# Patient Record
Sex: Female | Born: 1975 | Race: White | Hispanic: Yes | Marital: Married | Smoking: Never smoker
Health system: Southern US, Community
[De-identification: ages and names within clinical notes are randomized; demographics above are authoritative.]

## PROBLEM LIST (undated history)

## (undated) DIAGNOSIS — M722 Plantar fascial fibromatosis: Secondary | ICD-10-CM

## (undated) DIAGNOSIS — Z8042 Family history of malignant neoplasm of prostate: Secondary | ICD-10-CM

## (undated) DIAGNOSIS — M419 Scoliosis, unspecified: Secondary | ICD-10-CM

## (undated) DIAGNOSIS — Z803 Family history of malignant neoplasm of breast: Secondary | ICD-10-CM

## (undated) DIAGNOSIS — J381 Polyp of vocal cord and larynx: Secondary | ICD-10-CM

## (undated) HISTORY — DX: Plantar fascial fibromatosis: M72.2

## (undated) HISTORY — DX: Family history of malignant neoplasm of breast: Z80.3

## (undated) HISTORY — PX: NO PAST SURGERIES: SHX2092

## (undated) HISTORY — DX: Polyp of vocal cord and larynx: J38.1

## (undated) HISTORY — DX: Scoliosis, unspecified: M41.9

## (undated) HISTORY — DX: Family history of malignant neoplasm of prostate: Z80.42

---

## 2005-07-05 ENCOUNTER — Ambulatory Visit: Payer: Self-pay | Admitting: Internal Medicine

## 2005-10-02 ENCOUNTER — Ambulatory Visit: Payer: Self-pay | Admitting: Internal Medicine

## 2007-02-20 ENCOUNTER — Ambulatory Visit: Payer: Self-pay | Admitting: Internal Medicine

## 2007-02-20 LAB — CONVERTED CEMR LAB
Albumin: 4.1 g/dL (ref 3.5–5.2)
CO2: 29 meq/L (ref 19–32)
Chloride: 107 meq/L (ref 96–112)
Eosinophils Relative: 2.4 % (ref 0.0–5.0)
GFR calc Af Amer: 84 mL/min
GFR calc non Af Amer: 69 mL/min
Glucose, Bld: 104 mg/dL — ABNORMAL HIGH (ref 70–99)
Glucose, Urine, Semiquant: NEGATIVE
HDL: 74.8 mg/dL (ref >39.0)
Ketones, urine, test strip: NEGATIVE
MCHC: 34 g/dL (ref 30.0–36.0)
Platelets: 190 10*3/uL (ref 150–400)
RBC: 3.94 M/uL (ref 3.87–5.11)
RDW: 11.4 % — ABNORMAL LOW (ref 11.5–14.6)
Sodium: 140 meq/L (ref 135–145)
Specific Gravity, Urine: 1.015
Total CHOL/HDL Ratio: 2.4
Triglycerides: 42 mg/dL (ref 0–149)
WBC: 4.4 10*3/uL — ABNORMAL LOW (ref 4.5–10.5)
pH: 8.5

## 2007-02-27 ENCOUNTER — Ambulatory Visit: Payer: Self-pay | Admitting: Internal Medicine

## 2007-02-27 ENCOUNTER — Encounter: Payer: Self-pay | Admitting: Internal Medicine

## 2007-02-27 ENCOUNTER — Other Ambulatory Visit: Admission: RE | Admit: 2007-02-27 | Discharge: 2007-02-27 | Payer: Self-pay | Admitting: Internal Medicine

## 2007-02-27 DIAGNOSIS — R7309 Other abnormal glucose: Secondary | ICD-10-CM | POA: Insufficient documentation

## 2007-04-16 ENCOUNTER — Telehealth: Payer: Self-pay | Admitting: Internal Medicine

## 2007-06-12 ENCOUNTER — Ambulatory Visit: Payer: Self-pay | Admitting: Family Medicine

## 2007-06-22 ENCOUNTER — Telehealth: Payer: Self-pay | Admitting: Internal Medicine

## 2008-04-01 ENCOUNTER — Encounter: Payer: Self-pay | Admitting: Internal Medicine

## 2009-02-23 ENCOUNTER — Ambulatory Visit: Payer: Self-pay | Admitting: Internal Medicine

## 2009-02-23 LAB — CONVERTED CEMR LAB
AST: 17 units/L (ref 0–37)
Alkaline Phosphatase: 42 units/L (ref 39–117)
BUN: 14 mg/dL (ref 6–23)
Basophils Absolute: 0 10*3/uL (ref 0.0–0.1)
Bilirubin Urine: NEGATIVE
Blood in Urine, dipstick: NEGATIVE
Cholesterol: 204 mg/dL — ABNORMAL HIGH (ref 0–200)
Direct LDL: 107.3 mg/dL
Eosinophils Absolute: 0.1 10*3/uL (ref 0.0–0.7)
Eosinophils Relative: 3.3 % (ref 0.0–5.0)
GFR calc non Af Amer: 76.71 mL/min (ref >60)
Glucose, Urine, Semiquant: NEGATIVE
HCT: 39.9 % (ref 36.0–46.0)
HDL: 79.7 mg/dL (ref >39.00)
Hemoglobin: 13.9 g/dL (ref 12.0–15.0)
Lymphs Abs: 1.5 10*3/uL (ref 0.7–4.0)
Monocytes Absolute: 0.3 10*3/uL (ref 0.1–1.0)
Neutro Abs: 2.3 10*3/uL (ref 1.4–7.7)
Potassium: 4.4 meq/L (ref 3.5–5.1)
Protein, U semiquant: NEGATIVE
RBC: 4.17 M/uL (ref 3.87–5.11)
RDW: 11.3 % — ABNORMAL LOW (ref 11.5–14.6)
Specific Gravity, Urine: 1.02
TSH: 2.32 microintl units/mL (ref 0.35–5.50)
Total CHOL/HDL Ratio: 3
VLDL: 12 mg/dL (ref 0.0–40.0)
WBC: 4.2 10*3/uL — ABNORMAL LOW (ref 4.5–10.5)
pH: 5.5

## 2009-03-07 ENCOUNTER — Other Ambulatory Visit: Admission: RE | Admit: 2009-03-07 | Discharge: 2009-03-07 | Payer: Self-pay | Admitting: Internal Medicine

## 2009-03-07 ENCOUNTER — Ambulatory Visit: Payer: Self-pay | Admitting: Internal Medicine

## 2009-03-07 ENCOUNTER — Encounter: Payer: Self-pay | Admitting: Internal Medicine

## 2010-03-16 ENCOUNTER — Encounter: Payer: Self-pay | Admitting: Internal Medicine

## 2010-03-16 ENCOUNTER — Ambulatory Visit: Payer: Self-pay | Admitting: Family Medicine

## 2010-03-16 DIAGNOSIS — N926 Irregular menstruation, unspecified: Secondary | ICD-10-CM | POA: Insufficient documentation

## 2010-03-16 LAB — CONVERTED CEMR LAB
Basophils Absolute: 0.1 10*3/uL (ref 0.0–0.1)
Basophils Relative: 1 % (ref 0–1)
Beta hcg, urine, semiquantitative: NEGATIVE
Eosinophils Absolute: 0.2 10*3/uL (ref 0.0–0.7)
Eosinophils Relative: 2 % (ref 0–5)
HCT: 38.9 % (ref 36.0–46.0)
Hemoglobin: 13.3 g/dL (ref 12.0–15.0)
MCV: 93.1 fL (ref 78.0–100.0)
Monocytes Absolute: 0.4 10*3/uL (ref 0.1–1.0)
Monocytes Relative: 6 % (ref 3–12)
Platelets: 225 10*3/uL (ref 150–400)
RBC: 4.18 M/uL (ref 3.87–5.11)
WBC: 6.1 10*3/uL (ref 4.0–10.5)

## 2010-03-21 ENCOUNTER — Telehealth: Payer: Self-pay | Admitting: Family Medicine

## 2010-03-26 ENCOUNTER — Encounter: Payer: Self-pay | Admitting: Sports Medicine

## 2010-05-10 ENCOUNTER — Ambulatory Visit: Payer: Self-pay | Admitting: Internal Medicine

## 2010-05-10 LAB — CONVERTED CEMR LAB
ALT: 17 units/L (ref 0–35)
AST: 21 units/L (ref 0–37)
Albumin: 4.2 g/dL (ref 3.5–5.2)
BUN: 17 mg/dL (ref 6–23)
Basophils Absolute: 0 10*3/uL (ref 0.0–0.1)
Basophils Relative: 0.5 % (ref 0.0–3.0)
Bilirubin Urine: NEGATIVE
CO2: 25 meq/L (ref 19–32)
Calcium: 9.1 mg/dL (ref 8.4–10.5)
Cholesterol: 233 mg/dL — ABNORMAL HIGH (ref 0–200)
Creatinine, Ser: 0.8 mg/dL (ref 0.4–1.2)
Direct LDL: 130.9 mg/dL
Eosinophils Absolute: 0.1 10*3/uL (ref 0.0–0.7)
Eosinophils Relative: 1.9 % (ref 0.0–5.0)
Glucose, Bld: 86 mg/dL (ref 70–99)
Glucose, Urine, Semiquant: NEGATIVE
MCHC: 34.6 g/dL (ref 30.0–36.0)
MCV: 95.3 fL (ref 78.0–100.0)
Monocytes Relative: 4.7 % (ref 3.0–12.0)
Neutrophils Relative %: 55.8 % (ref 43.0–77.0)
RBC: 3.99 M/uL (ref 3.87–5.11)
Sodium: 137 meq/L (ref 135–145)
Total Bilirubin: 0.4 mg/dL (ref 0.3–1.2)
VLDL: 12 mg/dL (ref 0.0–40.0)
WBC Urine, dipstick: NEGATIVE
WBC: 4.2 10*3/uL — ABNORMAL LOW (ref 4.5–10.5)
pH: 6.5

## 2010-05-16 ENCOUNTER — Other Ambulatory Visit
Admission: RE | Admit: 2010-05-16 | Discharge: 2010-05-16 | Payer: Self-pay | Source: Home / Self Care | Admitting: Internal Medicine

## 2010-05-16 ENCOUNTER — Ambulatory Visit: Payer: Self-pay | Admitting: Internal Medicine

## 2010-05-16 DIAGNOSIS — M25559 Pain in unspecified hip: Secondary | ICD-10-CM | POA: Insufficient documentation

## 2010-05-21 LAB — CONVERTED CEMR LAB
Free T4: 0.76 ng/dL (ref 0.60–1.60)
TSH: 2.46 microintl units/mL (ref 0.35–5.50)

## 2010-05-28 LAB — CONVERTED CEMR LAB: Pap Smear: NEGATIVE

## 2010-06-12 ENCOUNTER — Ambulatory Visit: Payer: Self-pay | Admitting: Sports Medicine

## 2010-06-12 DIAGNOSIS — R269 Unspecified abnormalities of gait and mobility: Secondary | ICD-10-CM | POA: Insufficient documentation

## 2010-06-12 DIAGNOSIS — M2170 Unequal limb length (acquired), unspecified site: Secondary | ICD-10-CM | POA: Insufficient documentation

## 2010-07-19 ENCOUNTER — Ambulatory Visit
Admission: RE | Admit: 2010-07-19 | Discharge: 2010-07-19 | Payer: Self-pay | Source: Home / Self Care | Attending: Sports Medicine | Admitting: Sports Medicine

## 2010-07-24 NOTE — Assessment & Plan Note (Signed)
Summary: CPX W/ PAP // RS   Vital Signs:  Patient profile:   35 year old female Menstrual status:  regular LMP:     05/02/2010 Height:      69.75 inches Weight:      194 pounds BMI:     28.14 Pulse rate:   66 / minute BP sitting:   120 / 80  (right arm) Cuff size:   regular  Vitals Entered By: Romualdo Bolk, CMA Duncan Dull) (May 16, 2010 11:47 AM)  Nutrition Counseling: Patient's BMI is greater than 25 and therefore counseled on weight management options. CC: CPX with pap LMP (date): 05/02/2010 LMP - Character: normal Menarche (age onset years): 10-11   Menses interval (days): 28 Menstrual flow (days): 4 Enter LMP: 05/02/2010 Last PAP Result NEGATIVE FOR INTRAEPITHELIAL LESIONS OR MALIGNANCY.   History of Present Illness: Patty Bell comes in today  fpr preventive visit   since you said she's been generally well but has gained some weight because of lifestyle issues she had been exercising but now is having problems after an  running  with   knee and right hip back pain.   She is seeing  [facility]   Dr Margaretha Sheffield.   and is just finishing physical therapy that she does not think helped very much.  there's no chest pain shortness of breath. She has had some irregular periods recently times one. She's due for a Pap smear.  Preventive Care Screening  Prior Values:    Pap Smear:  NEGATIVE FOR INTRAEPITHELIAL LESIONS OR MALIGNANCY. (03/07/2009)    Last Tetanus Booster:  Historical (06/24/2005)   Preventive Screening-Counseling & Management  Alcohol-Tobacco     Alcohol drinks/day: 2     Alcohol type: wine     Smoking Status: never  Caffeine-Diet-Exercise     Caffeine use/day: 1-2     Does Patient Exercise: yes     Type of exercise: volleyball     Times/week: 3  Hep-HIV-STD-Contraception     Dental Visit-last 6 months yes     Sun Exposure-Excessive: no  Safety-Violence-Falls     Seat Belt Use: yes     Firearms in the Home: no firearms in the home  Smoke Detectors: yes  Current Problems (verified): 1)  Irregular Menses  (ICD-626.4) 2)  Hyperglycemia, Fasting  (ICD-790.29) 3)  Gynecological Examination, Routine  (ICD-V72.31) 4)  Well Woman  (ICD-V70.0)  Current Medications (verified): 1)  None  Allergies (verified): 1)  ! Amoxicillin  Past History:  Past medical, surgical, family and social histories (including risk factors) reviewed, and no changes noted (except as noted below).  Past Medical History: asthma as a child cyst or polyp in her throat as a child plantar fasciitis G1P0  Past History:  Care Management: None Current Sports medicine : [facility]  Family History: Reviewed history from 03/07/2009 and no changes required. Family History High cholesterol parent and grandparent Family History Hypertension Family History of Cardiovascular disorder Fam hx stroke Neg hx breast cancer   Social History: Reviewed history from 03/07/2009 and no changes required. 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  no pets   Review of Systems       12 sytem review neg except as per HPI  Physical Exam  General:  Well-developed,well-nourished,in no acute distress; alert,appropriate and cooperative throughout examination Head:  normocephalic, atraumatic, and no abnormalities  observed.   Eyes:  PERRL, EOMs full, conjunctiva clear  Ears:  R ear normal, L ear normal, and no external deformities.   Nose:  no external deformity, no external erythema, and no nasal discharge.   Mouth:  good dentition and pharynx pink and moist.   Neck:  No deformities, masses, or tenderness noted. Breasts:  No mass, nodules, thickening, tenderness, bulging, retraction, inflamation, nipple discharge or skin changes noted.   Lungs:  Normal respiratory effort, chest expands symmetrically. Lungs are  clear to auscultation, no crackles or wheezes. Heart:  Normal rate and regular rhythm. S1 and S2 normal without gallop, murmur, click, rub or other extra sounds. Abdomen:  Bowel sounds positive,abdomen soft and non-tender without masses, organomegaly or hernias noted. Genitalia:  Pelvic Exam:        External: normal female genitalia without lesions or masses        Vagina: normal without lesions or masses        Cervix: normal without lesions or masses        Adnexa: normal bimanual exam without masses or fullness        Uterus: normal by palpation        Pap smear: performed Msk:  no joint swelling, no joint warmth, and no redness over joints.   no redness  gait ok  Pulses:  pulses intact without delay   Extremities:  no clubbing cyanosis or edema  Neurologic:  alert & oriented X3, strength normal in all extremities, and gait normal.   Skin:  turgor normal, color normal, no ecchymoses, no petechiae, no purpura, no ulcerations, and no edema.   Cervical Nodes:  No lymphadenopathy noted Axillary Nodes:  No palpable lymphadenopathy Inguinal Nodes:  No significant adenopathy Psych:  Oriented X3, normally interactive, and good eye contact.   labs reviewed tsh was not performed as expected so will get this test today  Impression & Recommendations:  Problem # 1:  WELL WOMAN (ICD-V70.0) Discussed nutrition,exercise,diet,healthy weight, vitamin D and calcium.    disc cross training  .  Orders: TLB-TSH (Thyroid Stimulating Hormone) (84443-TSH) TLB-T4 (Thyrox), Free [phone])  Problem # 2:  GYNECOLOGICAL EXAMINATION, ROUTINE (ICD-V72.31)  PAP done   disc   expectation about conception  and fertility  Orders: Pap Smear, Thin Prep ( Collection of) (B1478)  Problem # 3:  IRREGULAR MENSES (ICD-626.4) could be realted to weight change and other  nl exam  disc fertility   and age.    Orders: TLB-T4 (Thyrox), Free [phone])  Problem # 4:  HIP PAIN (ICD-719.45)  .fu with Dr Margaretha Sheffield  as  dcussed  Problem # 5:  HYPERGLYCEMIA, FASTING (ICD-790.29) Assessment: Improved  Labs Reviewed: Creat: 0.8 (05/10/2010)     Patient Instructions: 1)  You will be informed of lab/PAP results when available.  2)  see about cross training exercise  3)  agree with heart healthy diet with " mediterranean type diet"   Orders Added: 1)  TLB-TSH (Thyroid Stimulating Hormone) [84443-TSH] 2)  TLB-T4 (Thyrox), Free [65784-ON6E] 3)  Est. Patient 18-39 years [99395] 4)  Pap Smear, Thin Prep ( Collection of) [Q0091]

## 2010-07-24 NOTE — Consult Note (Signed)
Summary: [facility]  [facility]   Imported By: Marily Memos 05/23/2010 11:41:02  _____________________________________________________________________  External Attachment:    Type:   Image     Comment:   External Document

## 2010-07-24 NOTE — Assessment & Plan Note (Signed)
Summary: irregular vag. bleeding and pain/dm   Vital Signs:  Patient profile:   35 year old female Menstrual status:  regular Weight:      190 pounds Temp:     98.0 degrees F oral BP sitting:   118 / 90  (left arm) Cuff size:   regular  Vitals Entered By: Sid Falcon LPN (March 16, 2010 3:58 PM) CC: irregular bleeding   History of Present Illness: Patient seen with some slight irregular vaginal bleeding.  Normally has menstrual period around the 20th of every month. This month was different in that she started her menstrual period on the 15th. Typical of usual periods with 5 days of menses and bleeding ceased by Sunday. She had no bleeding on Monday or Tuesday and Wednesday had some recurrence of bleeding followed by some mild spotting on Thursday. Essentially no bleeding or spotting today. Other than some mild perimenstrual cramping, no pain. Denies any fever or chills. Has been off birth control for about 3 years now. No aspirin use.  No other bleeding problems.  No fever or chills.  Has upcoming PE soon.  Allergies: 1)  ! 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  PMH reviewed for relevance, SH/Risk Factors reviewed for relevance  Review of Systems  The patient denies anorexia, fever, weight loss, abdominal pain, melena, hematochezia, hematuria, and suspicious skin lesions.    Physical Exam  General:  Well-developed,well-nourished,in no acute distress; alert,appropriate and cooperative throughout examination Lungs:  Normal respiratory effort, chest expands symmetrically. Lungs are clear to auscultation, no crackles or  wheezes. Heart:  Normal rate and regular rhythm. S1 and S2 normal without gallop, murmur, click, rub or other extra sounds. Abdomen:  Bowel sounds positive,abdomen soft and non-tender without masses, organomegaly or hernias noted. Genitalia:  bimanual.  Cervix nontender.  Uterus normal size and nontender.  No adnexal mass or tenderness noted.  No bleeding noted at this time.   Impression & Recommendations:  Problem # 1:  IRREGULAR MENSES (ICD-626.4) Assessment New mild irregularity.  Start with preg test and CBC.  consider ultrasound if persists. Orders: Pregnancy Test, serum qualitative (47425) TLB-CBC Platelet - w/Differential (85025-CBCD) Venipuncture (95638) Specimen Handling (75643)  Patient Instructions: 1)  Follow up promptly for any fever, increased pelvic pain, or any heavy vaginal bleeding.  Laboratory Results   Urine Tests  Date/Time Recieved: March 16, 2010 4:45 PM  Date/Time Reported: March 16, 2010 4:45 PM     Urine HCG: negative Comments: Wynona Canes, CMA  March 16, 2010 4:45 PM

## 2010-07-24 NOTE — Progress Notes (Signed)
Summary: lab results  Phone Note Call from Patient   Caller: Patient Call For: Patty Bell Headings MD Summary of Call: Pt is asking for results of labs. [phone] Initial call taken by: Lynann Beaver CMA,  March 21, 2010 9:39 AM  Follow-up for Phone Call        CBC normal.  I did not see result until now as this had been routed to  Dr Fabian Sharp. Follow-up by: Evelena Peat MD,  March 21, 2010 9:45 AM  Additional Follow-up for Phone Call Additional follow up Details #1::        [facility] Additional Follow-up by: Lynann Beaver CMA,  March 21, 2010 9:54 AM    Additional Follow-up for Phone Call Additional follow up Details #2::    Results given  on callback &copy at front for pickup as requested. Follow-up by: Rudy Jew, RN,  March 21, 2010 10:14 AM

## 2010-07-26 NOTE — Assessment & Plan Note (Signed)
Summary: NP,UPPER HIP PAIN X 6 MONTHS,STARTING TO RUN,MC   Vital Signs:  Patient profile:   35 year old female Menstrual status:  regular Height:      69.75 inches Weight:      190 pounds BMI:     27.56 Pulse rate:   81 / minute BP sitting:   142 / 93  (right arm)  Vitals Entered By: Rochele Pages RN (June 12, 2010 11:33 AM) CC: rt hip pain x6 months   CC:  rt hip pain x6 months.  History of Present Illness: Pt presents to clinic for evaluation of rt superior hip pain that she has experienced x 6 months.  She also experienced lt lateral knee pain that has since resolved.  She is a new runner x 8 months- has run 2 - 10 ks, usually runs approx 3 miles 3 times per week.   In November the lt hip pain got sharp and severe,  hard to sleep at night.  Pain has since improved, now is a constant dull aching.  Has taken ibuprofen rarely and this is helpful.     Hx of playing competitive volleyball for 20 yrs.   Has seen Dr Margaretha Sheffield in past and done PT but did not completly resolve issues  Dr Adonis Housekeeper ( a fried and SM doc) suggested we see her for opinion/  Dr Margaretha Sheffield noted that she may want [place] to do a biomechanical assessment  Preventive Screening-Counseling & Management  Alcohol-Tobacco     Smoking Status: never  Allergies: 1)  ! Amoxicillin  Physical Exam  General:  Well-developed,well-nourished,in no acute distress; alert,appropriate and cooperative throughout examination Msk:  Tenderness at anterior third of iliac crest near insertion of tensor facia lata  Internal and external rotation strong  hip flexion strong  quad strength strong Lt leg 2 cm longer than rt Knee to chst neg bilat Faber neg bilat SI joint neg Good abduction strength bilat Lt tensor fasciaelatae stronger than rt Moderately high arches Mild transverse arch loss bilat    Extremities:  Gait does show that RT hemi pelvis rotates differently than left there is a mild trendelenburg shift to RT and foot  turnout which worsens the leg length diff   Impression & Recommendations:  Problem # 1:  HIP PAIN (ICD-719.45) Injury pattern does suggest that this arises from tensor fascia lata  weak on testing this muscle and on sartorius on RT  likely that this is triggered by biomechanical stress as no similar findings on left  Problem # 2:  UNEQUAL LEG LENGTH (ICD-736.81) correction placed in shoe on RT with insole and lift  This does improve gait and even lessens turnout on RT somewhat  will need to do gait drills though  Problem # 3:  ABNORMALITY OF GAIT (ICD-781.2)  work with exercise program  try correction for 1 month  ease into running with pain level <3/10 and no limp  reck 1 month  Orders: Sports Insoles [phone])  Patient Instructions: 1)  You have an injury to your tensor fasciaelatae due to your leg length difference.   2)  Do exercises from paperwork daily 3)  Pigeon toed walking across a room or up steps 4)  Running line drills two times per week  5)  Return for follow up in 4-6 weeks   Orders Added: 1)  Est. Patient Level III [60454] 2)  Sports Insoles [L3510]

## 2010-07-26 NOTE — Assessment & Plan Note (Signed)
Summary: FU APPT HIP PAIN/MJD   Vital Signs:  Patient profile:   35 year old female Menstrual status:  regular Pulse rate:   67 / minute BP sitting:   126 / 85  (right arm)  Vitals Entered By: Rochele Pages RN (July 19, 2010 3:47 PM) CC: f/u L hip pain, and rt lateral knee pain   CC:  f/u L hip pain and and rt lateral knee pain.  History of Present Illness: 35 yo Female here for R hip pain f/u  Seen initially in December 2010 Was given heel insert and HEP for leg length discrepancy (L > R) Hip is doing well-90% improved. Compliant with HEP. Does not wake her up from sleep at night. Not taking any Rx or OTC. Running up to 9 miles/week  c/o L lateral knee pain with and after activity. Working out with trainer 2x/week. Does admit to increasing physical activity this week. Previous hx of IT band friction syndrome but is resolved.  Preventive Screening-Counseling & Management  Alcohol-Tobacco     Smoking Status: never  Allergies: 1)  ! Amoxicillin  Physical Exam  General:  Well-developed,well-nourished,in no acute distress; alert,appropriate and cooperative throughout examination Msk:  Approx 80 degrees of hip rotation bilaterally No TTP of hips bilaterally Full active and passive ROM of hips bilaterally Good hip strength bilaterally Good strength of L tensor fascia latae Good strength of sartorius muscle bilaterally No TTP of knees bilaterally Full active and passive ROM of knees bilaterally Negative Thessaly test on L knee Negative Lachman test on L knee  lt knee exam shows no effusion; stable ligaments; negative Mcmurray's and provocative meniscal tests; non painful patellar compression; patellar and quadriceps tendons unremarkable.   Additional Exam:  Slight out-toeing of the R foot during jog   Impression & Recommendations:  Problem # 1:  HIP PAIN (ICD-719.45) Assessment Improved 90% resolved  use exercises at least 3x wk  Problem # 2:  UNEQUAL LEG  LENGTH (ICD-736.81) use lift sin most shoes as the difference is 2 cms and is significant  extra lifts given for non sports shoes  Problem # 3:  ABNORMALITY OF GAIT (ICD-781.2) this is improving with exercises  I think it triggers some ITB sxs on left knee keep working gait  stretches given  reck if it does not resolve otehrwise as needed RTC   Orders Added: 1)  Est. Patient Level III [16109]

## 2011-05-14 ENCOUNTER — Other Ambulatory Visit (INDEPENDENT_AMBULATORY_CARE_PROVIDER_SITE_OTHER): Payer: 59

## 2011-05-14 DIAGNOSIS — Z0000 Encounter for general adult medical examination without abnormal findings: Secondary | ICD-10-CM

## 2011-05-14 LAB — LIPID PANEL
Cholesterol: 204 mg/dL — ABNORMAL HIGH (ref 0–200)
HDL: 94.8 mg/dL (ref >39.00)
Total CHOL/HDL Ratio: 2
Triglycerides: 78 mg/dL (ref 0.0–149.0)
VLDL: 15.6 mg/dL (ref 0.0–40.0)

## 2011-05-14 LAB — POCT URINALYSIS DIPSTICK
Blood, UA: NEGATIVE
Glucose, UA: NEGATIVE
Spec Grav, UA: 1.025

## 2011-05-14 LAB — BASIC METABOLIC PANEL
BUN: 18 mg/dL (ref 6–23)
CO2: 23 mEq/L (ref 19–32)
Calcium: 9.5 mg/dL (ref 8.4–10.5)
Chloride: 106 mEq/L (ref 96–112)
Creatinine, Ser: 1 mg/dL (ref 0.4–1.2)
GFR: 71.12 mL/min (ref >60.00)
Glucose, Bld: 84 mg/dL (ref 70–99)
Potassium: 4 mEq/L (ref 3.5–5.1)
Sodium: 140 mEq/L (ref 135–145)

## 2011-05-14 LAB — CBC WITH DIFFERENTIAL/PLATELET
Basophils Absolute: 0 10*3/uL (ref 0.0–0.1)
Basophils Relative: 0.5 % (ref 0.0–3.0)
Eosinophils Absolute: 0.1 10*3/uL (ref 0.0–0.7)
Eosinophils Relative: 1.3 % (ref 0.0–5.0)
HCT: 38.8 % (ref 36.0–46.0)
Hemoglobin: 13.1 g/dL (ref 12.0–15.0)
Lymphocytes Relative: 27.2 % (ref 12.0–46.0)
Lymphs Abs: 1.4 10*3/uL (ref 0.7–4.0)
MCHC: 33.9 g/dL (ref 30.0–36.0)
MCV: 97 fl (ref 78.0–100.0)
Monocytes Absolute: 0.3 10*3/uL (ref 0.1–1.0)
Monocytes Relative: 5.1 % (ref 3.0–12.0)
Neutro Abs: 3.3 10*3/uL (ref 1.4–7.7)
Neutrophils Relative %: 65.9 % (ref 43.0–77.0)
Platelets: 152 10*3/uL (ref 150.0–400.0)
RBC: 4 Mil/uL (ref 3.87–5.11)
RDW: 12.1 % (ref 11.5–14.6)
WBC: 5 10*3/uL (ref 4.5–10.5)

## 2011-05-14 LAB — TSH: TSH: 2.25 u[IU]/mL (ref 0.35–5.50)

## 2011-05-14 LAB — HEPATIC FUNCTION PANEL
Albumin: 4.6 g/dL (ref 3.5–5.2)
Alkaline Phosphatase: 39 U/L (ref 39–117)
Total Protein: 7.2 g/dL (ref 6.0–8.3)

## 2011-05-15 LAB — LDL CHOLESTEROL, DIRECT: Direct LDL: 103.4 mg/dL

## 2011-05-27 ENCOUNTER — Encounter: Payer: Self-pay | Admitting: Internal Medicine

## 2011-05-28 ENCOUNTER — Ambulatory Visit (INDEPENDENT_AMBULATORY_CARE_PROVIDER_SITE_OTHER): Payer: 59 | Admitting: Internal Medicine

## 2011-05-28 ENCOUNTER — Encounter: Payer: Self-pay | Admitting: Internal Medicine

## 2011-05-28 VITALS — BP 110/70 | HR 60 | Ht 69.75 in | Wt 182.0 lb

## 2011-05-28 DIAGNOSIS — Z0000 Encounter for general adult medical examination without abnormal findings: Secondary | ICD-10-CM

## 2011-05-28 DIAGNOSIS — Z7189 Other specified counseling: Secondary | ICD-10-CM

## 2011-05-28 NOTE — Progress Notes (Signed)
  Subjective:    Patient ID: Patty Bell, female    DOB: 1976-03-24, 35 y.o.   MRN: [id number]  HPI Patient comes in today for Preventive Health Care visit  No major change in health status since last visit .   Marathon recently.  Runs regularly Has  Leg length discrepancy.  That caused her hip pain  and now has insert.  better.  Uses voltaren gel occassionally. Considering    Conception next year.  Disc  Periods ocass irreg  By a week or so    Pap last year and normal on menses today Review of Systems ROS:  GEN/ HEENTNo fever, significant weight changes sweats headaches vision problems hearing changes, CV/ PULM; No chest pain shortness of breath cough, syncope,edema  change in exercise tolerance. GI /GU: No adominal pain, vomiting, change in bowel habits. No blood in the stool. No significant GU symptoms. SKIN/HEME: ,no acute skin rashes suspicious lesions or bleeding. No lymphadenopathy, nodules, masses.  NEURO/ PSYCH:  No neurologic signs such as weakness numbness No depression anxiety. IMM/ Allergy: No unusual infections.  Allergy .   REST of 12 system review negative     Objective:   Physical Exam Physical Exam: Vital signs reviewed WUJ:WJXB is a well-developed well-nourished alert cooperative  white female who appears her stated age in no acute distress.  HEENT: normocephalic  traumatic , Eyes: PERRL EOM's full, conjunctiva clear, Nares: paten,t no deformity discharge or tenderness., Ears: no deformity EAC's clear TMs with normal landmarks. Mouth: clear OP, no lesions, edema.  Moist mucous membranes. Dentition in adequate repair. NECK: supple without masses, thyromegaly or bruits. Breast: normal by inspection . No dimpling, discharge, masses, tenderness or discharge . LN: no cervical axillary inguinal adenopathy  CHEST/PULM:  Clear to auscultation and percussion breath sounds equal no wheeze , rales or rhonchi. No chest wall deformities or tenderness. CV: PMI is  nondisplaced, S1 S2 no gallops, murmurs, rubs. Peripheral pulses are full without delay.No JVD .  ABDOMEN: Bowel sounds normal nontender  No guard or rebound, no hepato splenomegal no CVA tenderness.  No hernia. Extremtities:  No clubbing cyanosis or edema, no acute joint swelling or redness no focal atrophy NEURO:  Oriented x3, cranial nerves 3-12 appear to be intact, no obvious focal weakness,gait within normal limits no abnormal reflexes or asymmetrical SKIN: No acute rashes normal turgor, color, no bruising or petechiae. PSYCH: Oriented, good eye contact, no obvious depression anxiety, cognition and judgment appear normal.  Lab Results  Component Value Date   WBC 5.0 05/14/2011   HGB 13.1 05/14/2011   HCT 38.8 05/14/2011   PLT 152.0 05/14/2011   GLUCOSE 84 05/14/2011   CHOL 204* 05/14/2011   TRIG 78.0 05/14/2011   HDL 94.80 05/14/2011   LDLDIRECT 103.4 05/14/2011   LDLCALC 94 02/20/2007   ALT 19 05/14/2011   AST 22 05/14/2011   NA 140 05/14/2011   K 4.0 05/14/2011   CL 106 05/14/2011   CREATININE 1.0 05/14/2011   BUN 18 05/14/2011   CO2 23 05/14/2011   TSH 2.25 05/14/2011          Assessment & Plan:  Preventive Health Care Counseled regarding healthy nutrition, exercise, sleep, injury prevention, calcium vit d and healthy weight .Labs good for now Preconception nutrition and rx for prenatals   Up to date  on healthcare parameters per hx .

## 2011-05-28 NOTE — Patient Instructions (Signed)
Continue lifestyle intervention healthy eating and exercise . Check up in a year  Take prenatal vitamins to get folic acid  Pre conception

## 2011-06-06 ENCOUNTER — Ambulatory Visit (INDEPENDENT_AMBULATORY_CARE_PROVIDER_SITE_OTHER): Payer: 59 | Admitting: Sports Medicine

## 2011-06-06 VITALS — BP 112/78

## 2011-06-06 DIAGNOSIS — M2170 Unequal limb length (acquired), unspecified site: Secondary | ICD-10-CM

## 2011-06-06 DIAGNOSIS — R269 Unspecified abnormalities of gait and mobility: Secondary | ICD-10-CM

## 2011-06-06 DIAGNOSIS — M25559 Pain in unspecified hip: Secondary | ICD-10-CM

## 2011-06-06 NOTE — Assessment & Plan Note (Signed)
Much improved with lift

## 2011-06-06 NOTE — Progress Notes (Signed)
  Subjective:    Patient ID: Patty Bell, female    DOB: [birth year], 35 y.o.   MRN: [id number]  HPI  Pt presents to clinic for f/u of lt hip pain which has resolved with sports insoles with heel lift on the left. Was able to run 1/2 marathon this year without any problems.  Pt would like to get a few pairs of sports insoles with heel lifts.   Does have some lt lateral leg pain that starts after mile 8 with running.     Review of Systems     Objective:   Physical Exam  NAD  Left leg is 2 cm longer SI joints move freely, rt slightly tighter Hip abduction strong bilat Good hip rotation  No pain on resisted testing      Assessment & Plan:

## 2011-06-06 NOTE — Assessment & Plan Note (Signed)
Cont with felt tapered correction  Given 4 pairs of insoles with correction

## 2011-06-06 NOTE — Assessment & Plan Note (Signed)
This has resolved and is doing well  Keep up pretzel stretches and some hip and step exercises

## 2011-12-05 LAB — OB RESULTS CONSOLE RUBELLA ANTIBODY, IGM: Rubella: IMMUNE

## 2011-12-05 LAB — OB RESULTS CONSOLE RPR: RPR: NONREACTIVE

## 2011-12-05 LAB — OB RESULTS CONSOLE HIV ANTIBODY (ROUTINE TESTING): HIV: NONREACTIVE

## 2011-12-05 LAB — OB RESULTS CONSOLE HEPATITIS B SURFACE ANTIGEN: Hepatitis B Surface Ag: NEGATIVE

## 2012-01-16 LAB — OB RESULTS CONSOLE GC/CHLAMYDIA: Gonorrhea: NEGATIVE

## 2012-04-03 ENCOUNTER — Telehealth: Payer: Self-pay | Admitting: Internal Medicine

## 2012-04-03 NOTE — Telephone Encounter (Signed)
Error/njr °

## 2012-06-02 ENCOUNTER — Encounter: Payer: 59 | Admitting: Internal Medicine

## 2012-06-03 ENCOUNTER — Other Ambulatory Visit: Payer: 59

## 2012-08-06 ENCOUNTER — Encounter (HOSPITAL_COMMUNITY): Payer: Self-pay | Admitting: Anesthesiology

## 2012-08-06 ENCOUNTER — Inpatient Hospital Stay (HOSPITAL_COMMUNITY)
Admission: AD | Admit: 2012-08-06 | Discharge: 2012-08-09 | DRG: 766 | Disposition: A | Discharge status: Home / Self Care | Payer: 59 | Source: Ambulatory Visit | Attending: Obstetrics and Gynecology | Admitting: Obstetrics and Gynecology

## 2012-08-06 ENCOUNTER — Encounter (HOSPITAL_COMMUNITY): Payer: Self-pay

## 2012-08-06 ENCOUNTER — Encounter (HOSPITAL_COMMUNITY)
Admission: AD | Disposition: A | Discharge status: Home / Self Care | Payer: Self-pay | Source: Ambulatory Visit | Attending: Obstetrics and Gynecology

## 2012-08-06 ENCOUNTER — Inpatient Hospital Stay (HOSPITAL_COMMUNITY): Payer: 59 | Admitting: Anesthesiology

## 2012-08-06 DIAGNOSIS — O09519 Supervision of elderly primigravida, unspecified trimester: Secondary | ICD-10-CM | POA: Diagnosis present

## 2012-08-06 DIAGNOSIS — O324XX0 Maternal care for high head at term, not applicable or unspecified: Secondary | ICD-10-CM | POA: Diagnosis present

## 2012-08-06 LAB — COMPREHENSIVE METABOLIC PANEL WITH GFR
ALT: 31 U/L (ref 0–35)
AST: 27 U/L (ref 0–37)
Albumin: 3.1 g/dL — ABNORMAL LOW (ref 3.5–5.2)
Alkaline Phosphatase: 131 U/L — ABNORMAL HIGH (ref 39–117)
BUN: 10 mg/dL (ref 6–23)
CO2: 17 meq/L — ABNORMAL LOW (ref 19–32)
Calcium: 10 mg/dL (ref 8.4–10.5)
Chloride: 98 meq/L (ref 96–112)
Creatinine, Ser: 0.71 mg/dL (ref 0.50–1.10)
GFR calc Af Amer: >90 mL/min
GFR calc non Af Amer: >90 mL/min
Glucose, Bld: 103 mg/dL — ABNORMAL HIGH (ref 70–99)
Potassium: 3.5 meq/L (ref 3.5–5.1)
Sodium: 133 meq/L — ABNORMAL LOW (ref 135–145)
Total Bilirubin: 0.4 mg/dL (ref 0.3–1.2)
Total Protein: 6.3 g/dL (ref 6.0–8.3)

## 2012-08-06 LAB — CBC
HCT: 37.2 % (ref 36.0–46.0)
Hemoglobin: 13.3 g/dL (ref 12.0–15.0)
MCH: 31.7 pg (ref 26.0–34.0)
MCHC: 35.8 g/dL (ref 30.0–36.0)
MCV: 88.6 fL (ref 78.0–100.0)
Platelets: 137 K/uL — ABNORMAL LOW (ref 150–400)
RBC: 4.2 MIL/uL (ref 3.87–5.11)
RDW: 12 % (ref 11.5–15.5)
WBC: 14.8 K/uL — ABNORMAL HIGH (ref 4.0–10.5)

## 2012-08-06 LAB — OB RESULTS CONSOLE GBS: GBS: NEGATIVE

## 2012-08-06 LAB — RPR: RPR Ser Ql: NONREACTIVE

## 2012-08-06 LAB — URIC ACID: Uric Acid, Serum: 5.9 mg/dL (ref 2.4–7.0)

## 2012-08-06 SURGERY — Surgical Case
Anesthesia: Epidural | Site: Abdomen | Wound class: Clean Contaminated

## 2012-08-06 MED ORDER — PHENYLEPHRINE HCL 10 MG/ML IJ SOLN
INTRAMUSCULAR | Status: DC | PRN
  Administered 2012-08-06 (×5): 40 ug via INTRAVENOUS

## 2012-08-06 MED ORDER — KETOROLAC TROMETHAMINE 30 MG/ML IJ SOLN: 15.0000 mg | Freq: Once | INTRAMUSCULAR | Status: DC | PRN

## 2012-08-06 MED ORDER — EPHEDRINE 5 MG/ML INJ
10.0000 mg | INTRAVENOUS | Status: DC | PRN
  Filled 2012-08-06: qty 4

## 2012-08-06 MED ORDER — ZOLPIDEM TARTRATE 5 MG PO TABS: 5.0000 mg | ORAL_TABLET | Freq: Every evening | ORAL | Status: DC | PRN

## 2012-08-06 MED ORDER — DIPHENHYDRAMINE HCL 25 MG PO CAPS: 25.0000 mg | ORAL_CAPSULE | Freq: Four times a day (QID) | ORAL | Status: DC | PRN

## 2012-08-06 MED ORDER — KETOROLAC TROMETHAMINE 30 MG/ML IJ SOLN: 30.0000 mg | Freq: Four times a day (QID) | INTRAMUSCULAR | Status: AC | PRN

## 2012-08-06 MED ORDER — NALBUPHINE SYRINGE 5 MG/0.5 ML
5.0000 mg | INJECTION | INTRAMUSCULAR | Status: DC | PRN
  Filled 2012-08-06: qty 1

## 2012-08-06 MED ORDER — FENTANYL 2.5 MCG/ML BUPIVACAINE 1/10 % EPIDURAL INFUSION (WH - ANES)
INTRAMUSCULAR | Status: AC
  Filled 2012-08-06: qty 125

## 2012-08-06 MED ORDER — MEPERIDINE HCL 25 MG/ML IJ SOLN
6.2500 mg | INTRAMUSCULAR | Status: DC | PRN
  Administered 2012-08-06: 6.25 mg via INTRAVENOUS

## 2012-08-06 MED ORDER — MEPERIDINE HCL 25 MG/ML IJ SOLN
INTRAMUSCULAR | Status: AC
  Filled 2012-08-06: qty 1

## 2012-08-06 MED ORDER — SODIUM CHLORIDE 0.9 % IJ SOLN: 3.0000 mL | INTRAMUSCULAR | Status: DC | PRN

## 2012-08-06 MED ORDER — HYDROMORPHONE HCL PF 1 MG/ML IJ SOLN: 0.2500 mg | INTRAMUSCULAR | Status: DC | PRN

## 2012-08-06 MED ORDER — SIMETHICONE 80 MG PO CHEW: 80.0000 mg | CHEWABLE_TABLET | ORAL | Status: DC | PRN

## 2012-08-06 MED ORDER — ONDANSETRON HCL 4 MG/2ML IJ SOLN: 4.0000 mg | INTRAMUSCULAR | Status: DC | PRN

## 2012-08-06 MED ORDER — FENTANYL 2.5 MCG/ML BUPIVACAINE 1/10 % EPIDURAL INFUSION (WH - ANES): 14.0000 mL/h | INTRAMUSCULAR | Status: DC

## 2012-08-06 MED ORDER — IBUPROFEN 600 MG PO TABS: 600.0000 mg | ORAL_TABLET | Freq: Four times a day (QID) | ORAL | Status: DC | PRN

## 2012-08-06 MED ORDER — ACETAMINOPHEN 325 MG PO TABS: 650.0000 mg | ORAL_TABLET | ORAL | Status: DC | PRN

## 2012-08-06 MED ORDER — OXYCODONE-ACETAMINOPHEN 5-325 MG PO TABS: 1.0000 | ORAL_TABLET | ORAL | Status: DC | PRN

## 2012-08-06 MED ORDER — SCOPOLAMINE 1 MG/3DAYS TD PT72
1.0000 | MEDICATED_PATCH | Freq: Once | TRANSDERMAL | Status: AC
  Administered 2012-08-06: 1.5 mg via TRANSDERMAL

## 2012-08-06 MED ORDER — LACTATED RINGERS IV SOLN
INTRAVENOUS | Status: DC
  Administered 2012-08-06: 03:00:00 via INTRAVENOUS

## 2012-08-06 MED ORDER — OXYTOCIN 10 UNIT/ML IJ SOLN
40.0000 [IU] | INTRAVENOUS | Status: DC | PRN
  Administered 2012-08-06: 40 [IU] via INTRAVENOUS

## 2012-08-06 MED ORDER — PHENYLEPHRINE 40 MCG/ML (10ML) SYRINGE FOR IV PUSH (FOR BLOOD PRESSURE SUPPORT): 80.0000 ug | PREFILLED_SYRINGE | INTRAVENOUS | Status: DC | PRN

## 2012-08-06 MED ORDER — DIPHENHYDRAMINE HCL 25 MG PO CAPS: 25.0000 mg | ORAL_CAPSULE | ORAL | Status: DC | PRN

## 2012-08-06 MED ORDER — LACTATED RINGERS IV SOLN
INTRAVENOUS | Status: DC
  Administered 2012-08-06 – 2012-08-07 (×3): via INTRAVENOUS

## 2012-08-06 MED ORDER — ONDANSETRON HCL 4 MG/2ML IJ SOLN: 4.0000 mg | Freq: Three times a day (TID) | INTRAMUSCULAR | Status: DC | PRN

## 2012-08-06 MED ORDER — MORPHINE SULFATE (PF) 0.5 MG/ML IJ SOLN
INTRAMUSCULAR | Status: DC | PRN
  Administered 2012-08-06: 4 mg via EPIDURAL
  Administered 2012-08-06: 1 mg via INTRAVENOUS

## 2012-08-06 MED ORDER — DIPHENHYDRAMINE HCL 50 MG/ML IJ SOLN: 12.5000 mg | INTRAMUSCULAR | Status: DC | PRN

## 2012-08-06 MED ORDER — LIDOCAINE HCL (PF) 1 % IJ SOLN
INTRAMUSCULAR | Status: DC | PRN
  Administered 2012-08-06 (×2): 9 mL

## 2012-08-06 MED ORDER — CITRIC ACID-SODIUM CITRATE 334-500 MG/5ML PO SOLN
30.0000 mL | ORAL | Status: DC | PRN
  Administered 2012-08-06: 30 mL via ORAL
  Filled 2012-08-06: qty 15

## 2012-08-06 MED ORDER — KETOROLAC TROMETHAMINE 30 MG/ML IJ SOLN
30.0000 mg | Freq: Four times a day (QID) | INTRAMUSCULAR | Status: AC | PRN
  Administered 2012-08-06: 30 mg via INTRAVENOUS
  Filled 2012-08-06: qty 1

## 2012-08-06 MED ORDER — KETOROLAC TROMETHAMINE 60 MG/2ML IM SOLN
INTRAMUSCULAR | Status: AC
  Filled 2012-08-06: qty 2

## 2012-08-06 MED ORDER — MEPERIDINE HCL 25 MG/ML IJ SOLN
INTRAMUSCULAR | Status: DC | PRN
  Administered 2012-08-06: 12.5 mg via INTRAVENOUS

## 2012-08-06 MED ORDER — MEPERIDINE HCL 25 MG/ML IJ SOLN: 6.2500 mg | INTRAMUSCULAR | Status: DC | PRN

## 2012-08-06 MED ORDER — OXYTOCIN 10 UNIT/ML IJ SOLN
INTRAMUSCULAR | Status: AC
  Filled 2012-08-06: qty 4

## 2012-08-06 MED ORDER — OXYTOCIN BOLUS FROM INFUSION: 500.0000 mL | INTRAVENOUS | Status: DC

## 2012-08-06 MED ORDER — IBUPROFEN 600 MG PO TABS
600.0000 mg | ORAL_TABLET | Freq: Four times a day (QID) | ORAL | Status: DC
  Administered 2012-08-06 – 2012-08-09 (×12): 600 mg via ORAL
  Filled 2012-08-06 (×12): qty 1

## 2012-08-06 MED ORDER — DIPHENHYDRAMINE HCL 50 MG/ML IJ SOLN: 25.0000 mg | INTRAMUSCULAR | Status: DC | PRN

## 2012-08-06 MED ORDER — PROMETHAZINE HCL 25 MG/ML IJ SOLN: 6.2500 mg | INTRAMUSCULAR | Status: DC | PRN

## 2012-08-06 MED ORDER — DIBUCAINE 1 % RE OINT: 1.0000 "application " | TOPICAL_OINTMENT | RECTAL | Status: DC | PRN

## 2012-08-06 MED ORDER — DEXTROSE 5 % IV SOLN
INTRAVENOUS | Status: DC | PRN
  Administered 2012-08-06: 06:00:00 via INTRAVENOUS
  Administered 2012-08-06: 100 mL via INTRAVENOUS

## 2012-08-06 MED ORDER — NALOXONE HCL 0.4 MG/ML IJ SOLN: 0.4000 mg | INTRAMUSCULAR | Status: DC | PRN

## 2012-08-06 MED ORDER — TETANUS-DIPHTH-ACELL PERTUSSIS 5-2.5-18.5 LF-MCG/0.5 IM SUSP: 0.5000 mL | Freq: Once | INTRAMUSCULAR | Status: DC

## 2012-08-06 MED ORDER — PRENATAL MULTIVITAMIN CH
1.0000 | ORAL_TABLET | Freq: Every day | ORAL | Status: DC
  Administered 2012-08-07 – 2012-08-09 (×3): 1 via ORAL
  Filled 2012-08-06 (×3): qty 1

## 2012-08-06 MED ORDER — WITCH HAZEL-GLYCERIN EX PADS: 1.0000 "application " | MEDICATED_PAD | CUTANEOUS | Status: DC | PRN

## 2012-08-06 MED ORDER — LACTATED RINGERS IV SOLN: 500.0000 mL | INTRAVENOUS | Status: DC | PRN

## 2012-08-06 MED ORDER — NALOXONE HCL 1 MG/ML IJ SOLN
1.0000 ug/kg/h | INTRAVENOUS | Status: DC | PRN
  Filled 2012-08-06: qty 2

## 2012-08-06 MED ORDER — FENTANYL 2.5 MCG/ML BUPIVACAINE 1/10 % EPIDURAL INFUSION (WH - ANES)
INTRAMUSCULAR | Status: DC | PRN
  Administered 2012-08-06: 14 mL/h via EPIDURAL

## 2012-08-06 MED ORDER — LIDOCAINE HCL (PF) 1 % IJ SOLN: 30.0000 mL | INTRAMUSCULAR | Status: DC | PRN

## 2012-08-06 MED ORDER — MORPHINE SULFATE 0.5 MG/ML IJ SOLN
INTRAMUSCULAR | Status: AC
  Filled 2012-08-06: qty 10

## 2012-08-06 MED ORDER — KETOROLAC TROMETHAMINE 60 MG/2ML IM SOLN
60.0000 mg | Freq: Once | INTRAMUSCULAR | Status: AC | PRN
  Administered 2012-08-06: 60 mg via INTRAMUSCULAR

## 2012-08-06 MED ORDER — LANOLIN HYDROUS EX OINT: 1.0000 "application " | TOPICAL_OINTMENT | CUTANEOUS | Status: DC | PRN

## 2012-08-06 MED ORDER — SODIUM BICARBONATE 8.4 % IV SOLN
INTRAVENOUS | Status: DC | PRN
  Administered 2012-08-06: 3 mL via EPIDURAL

## 2012-08-06 MED ORDER — MENTHOL 3 MG MT LOZG: 1.0000 | LOZENGE | OROMUCOSAL | Status: DC | PRN

## 2012-08-06 MED ORDER — OXYTOCIN 40 UNITS IN LACTATED RINGERS INFUSION - SIMPLE MED: 62.5000 mL/h | INTRAVENOUS | Status: AC

## 2012-08-06 MED ORDER — LACTATED RINGERS IV SOLN
INTRAVENOUS | Status: DC | PRN
  Administered 2012-08-06: 05:00:00 via INTRAVENOUS

## 2012-08-06 MED ORDER — FLEET ENEMA 7-19 GM/118ML RE ENEM: 1.0000 | ENEMA | RECTAL | Status: DC | PRN

## 2012-08-06 MED ORDER — OXYTOCIN 40 UNITS IN LACTATED RINGERS INFUSION - SIMPLE MED: 62.5000 mL/h | INTRAVENOUS | Status: DC

## 2012-08-06 MED ORDER — METOCLOPRAMIDE HCL 5 MG/ML IJ SOLN: 10.0000 mg | Freq: Three times a day (TID) | INTRAMUSCULAR | Status: DC | PRN

## 2012-08-06 MED ORDER — SCOPOLAMINE 1 MG/3DAYS TD PT72
MEDICATED_PATCH | TRANSDERMAL | Status: AC
  Filled 2012-08-06: qty 1

## 2012-08-06 MED ORDER — LACTATED RINGERS IV SOLN: 500.0000 mL | Freq: Once | INTRAVENOUS | Status: DC

## 2012-08-06 MED ORDER — ONDANSETRON HCL 4 MG/2ML IJ SOLN: 4.0000 mg | Freq: Four times a day (QID) | INTRAMUSCULAR | Status: DC | PRN

## 2012-08-06 MED ORDER — GENTAMICIN SULFATE 40 MG/ML IJ SOLN
INTRAVENOUS | Status: DC
  Filled 2012-08-06: qty 9.96

## 2012-08-06 MED ORDER — SIMETHICONE 80 MG PO CHEW
80.0000 mg | CHEWABLE_TABLET | Freq: Three times a day (TID) | ORAL | Status: DC
  Administered 2012-08-06 – 2012-08-09 (×11): 80 mg via ORAL

## 2012-08-06 MED ORDER — SENNOSIDES-DOCUSATE SODIUM 8.6-50 MG PO TABS
2.0000 | ORAL_TABLET | Freq: Every day | ORAL | Status: DC
  Administered 2012-08-06 – 2012-08-08 (×3): 2 via ORAL

## 2012-08-06 MED ORDER — ONDANSETRON HCL 4 MG/2ML IJ SOLN
INTRAMUSCULAR | Status: DC | PRN
  Administered 2012-08-06: 4 mg via INTRAVENOUS

## 2012-08-06 MED ORDER — EPHEDRINE 5 MG/ML INJ: 10.0000 mg | INTRAVENOUS | Status: DC | PRN

## 2012-08-06 MED ORDER — ONDANSETRON HCL 4 MG PO TABS: 4.0000 mg | ORAL_TABLET | ORAL | Status: DC | PRN

## 2012-08-06 MED ORDER — OXYCODONE-ACETAMINOPHEN 5-325 MG PO TABS
1.0000 | ORAL_TABLET | ORAL | Status: DC | PRN
  Administered 2012-08-07 – 2012-08-08 (×5): 1 via ORAL
  Filled 2012-08-06 (×5): qty 1

## 2012-08-06 SURGICAL SUPPLY — 29 items
CLOTH BEACON ORANGE TIMEOUT ST (SAFETY) ×2 IMPLANT
DRAPE LG THREE QUARTER DISP (DRAPES) ×2 IMPLANT
DRESSING TELFA 8X3 (GAUZE/BANDAGES/DRESSINGS) IMPLANT
DRSG OPSITE POSTOP 4X10 (GAUZE/BANDAGES/DRESSINGS) ×2 IMPLANT
DURAPREP 26ML APPLICATOR (WOUND CARE) ×2 IMPLANT
ELECT REM PT RETURN 9FT ADLT (ELECTROSURGICAL) ×2
ELECTRODE REM PT RTRN 9FT ADLT (ELECTROSURGICAL) ×1 IMPLANT
EXTRACTOR VACUUM M CUP 4 TUBE (SUCTIONS) IMPLANT
GAUZE SPONGE 4X4 12PLY STRL LF (GAUZE/BANDAGES/DRESSINGS) IMPLANT
GLOVE BIO SURGEON STRL SZ8 (GLOVE) ×2 IMPLANT
GLOVE SURG ORTHO 8.0 STRL STRW (GLOVE) ×2 IMPLANT
GOWN PREVENTION PLUS LG XLONG (DISPOSABLE) ×4 IMPLANT
KIT ABG SYR 3ML LUER SLIP (SYRINGE) ×2 IMPLANT
NEEDLE HYPO 25X5/8 SAFETYGLIDE (NEEDLE) ×2 IMPLANT
NS IRRIG 1000ML POUR BTL (IV SOLUTION) ×2 IMPLANT
PACK C SECTION WH (CUSTOM PROCEDURE TRAY) ×2 IMPLANT
PAD ABD 7.5X8 STRL (GAUZE/BANDAGES/DRESSINGS) IMPLANT
PAD OB MATERNITY 4.3X12.25 (PERSONAL CARE ITEMS) ×2 IMPLANT
SLEEVE SCD COMPRESS KNEE MED (MISCELLANEOUS) IMPLANT
STAPLER VISISTAT 35W (STAPLE) IMPLANT
SUT MNCRL 0 VIOLET CTX 36 (SUTURE) ×3 IMPLANT
SUT MONOCRYL 0 CTX 36 (SUTURE) ×3
SUT PDS AB 1 CT  36 (SUTURE)
SUT PDS AB 1 CT 36 (SUTURE) IMPLANT
SUT VIC AB 1 CTX 36 (SUTURE) ×2
SUT VIC AB 1 CTX36XBRD ANBCTRL (SUTURE) ×1 IMPLANT
TOWEL OR 17X24 6PK STRL BLUE (TOWEL DISPOSABLE) ×6 IMPLANT
TRAY FOLEY CATH 14FR (SET/KITS/TRAYS/PACK) ×2 IMPLANT
WATER STERILE IRR 1000ML POUR (IV SOLUTION) ×2 IMPLANT

## 2012-08-06 NOTE — Anesthesia Postprocedure Evaluation (Signed)
  Anesthesia Post-op Note  Patient: Patty Bell  Procedure(s) Performed: Procedure(s): CESAREAN SECTION (N/A)  Patient Location: Mother/Baby  Anesthesia Type:Epidural  Level of Consciousness: awake  Airway and Oxygen Therapy: Patient Spontanous Breathing  Post-op Pain: mild  Post-op Assessment: Patient's Cardiovascular Status Stable and Respiratory Function Stable  Post-op Vital Signs: stable  Complications: No apparent anesthesia complications

## 2012-08-06 NOTE — Progress Notes (Signed)
Patient ID: Patty Bell, female   DOB: 02-04-1976, 37 y.o.   MRN: [id number] Pt comfortable with epidural except pelvic pressure with ctxs VSSAF FHR 90-130s with recurrent decels but occas accels and mod variabilty Ctxs q 2-3 minutes Cx c/c/0 After pushing for 30 minutes with good expulsive effort no descent affected.  IVF and O2 on.  Plan primary C/S for arrest of descent and nonreassuring FHR with meconium. Risks and benefits of C/S were discussed.  All questions were answered and informed consent was obtained.  Plan to proceed with low segment transverse Cesarean Section.

## 2012-08-06 NOTE — Anesthesia Procedure Notes (Signed)
Epidural Patient location during procedure: OB Start time: 08/06/2012 3:45 AM End time: 08/06/2012 3:59 AM  Staffing Anesthesiologist: Sandrea Hughs Performed by: anesthesiologist   Preanesthetic Checklist Completed: patient identified, site marked, surgical consent, pre-op evaluation, timeout performed, IV checked, risks and benefits discussed and monitors and equipment checked  Epidural Patient position: sitting Prep: site prepped and draped and DuraPrep Patient monitoring: continuous pulse ox and blood pressure Approach: midline Injection technique: LOR air  Needle:  Needle type: Tuohy  Needle gauge: 17 G Needle length: 9 cm and 9 Needle insertion depth: 6 cm Catheter type: closed end flexible Catheter size: 19 Gauge Catheter at skin depth: 11 cm Test dose: negative and Other  Assessment Sensory level: T11 Events: blood not aspirated, injection not painful, no injection resistance, negative IV test and no paresthesia  Additional Notes Reason for block:procedure for pain

## 2012-08-06 NOTE — Anesthesia Postprocedure Evaluation (Signed)
Anesthesia Post Note  Patient: Patty Bell  Procedure(s) Performed: Procedure(s) (LRB): CESAREAN SECTION (N/A)  Anesthesia type: Epidural Patient location: PACU  Post pain: Pain level controlled  Post assessment: Post-op Vital signs reviewed  Last Vitals:  Filed Vitals:   08/06/12 0715  BP: 108/55  Pulse: 59  Temp:   Resp: 22    Post vital signs: Reviewed  Level of consciousness: awake  Complications: No apparent anesthesia complications

## 2012-08-06 NOTE — Anesthesia Preprocedure Evaluation (Signed)
Anesthesia Evaluation  Patient identified by MRN, date of birth, ID band Patient awake    Reviewed: Allergy & Precautions, H&P , NPO status , Patient's Chart, lab work & pertinent test results  Airway Mallampati: II TM Distance: >3 FB Neck ROM: full    Dental no notable dental hx.    Pulmonary    Pulmonary exam normal       Cardiovascular negative cardio ROS      Neuro/Psych negative neurological ROS  negative psych ROS   GI/Hepatic negative GI ROS, Neg liver ROS,   Endo/Other  negative endocrine ROS  Renal/GU negative Renal ROS  negative genitourinary   Musculoskeletal   Abdominal Normal abdominal exam  (+)   Peds  Hematology negative hematology ROS (+)   Anesthesia Other Findings   Reproductive/Obstetrics (+) Pregnancy                           Anesthesia Physical Anesthesia Plan  ASA: II  Anesthesia Plan: Epidural   Post-op Pain Management:    Induction:   Airway Management Planned:   Additional Equipment:   Intra-op Plan:   Post-operative Plan:   Informed Consent: I have reviewed the patients History and Physical, chart, labs and discussed the procedure including the risks, benefits and alternatives for the proposed anesthesia with the patient or authorized representative who has indicated his/her understanding and acceptance.     Plan Discussed with:   Anesthesia Plan Comments:         Anesthesia Quick Evaluation

## 2012-08-06 NOTE — Op Note (Signed)
Cesarean Section Procedure Note  Pre-operative Diagnosis: IUP at 39 4/7, Thick meconium, nonreassuring fetal tracing, arrest of descent  Post-operative Diagnosis: same  Surgeon: Turner Daniels   Assistants: none  Anesthesia:epidural  Procedure:  Low Segment Transverse cesarean section  Procedure Details  The patient was seen in the Holding Room. The risks, benefits, complications, treatment options, and expected outcomes were discussed with the patient.  The patient concurred with the proposed plan, giving informed consent.  The site of surgery properly noted/marked.. A Time Out was held and the above information confirmed.  After induction of anesthesia, the patient was draped and prepped in the usual sterile manner. A Pfannenstiel incision was made and carried down through the subcutaneous tissue to the fascia. Fascial incision was made and extended transversely. The fascia was separated from the underlying rectus tissue superiorly and inferiorly. The peritoneum was identified and entered. Peritoneal incision was extended longitudinally. The utero-vesical peritoneal reflection was incised transversely and the bladder flap was bluntly freed from the lower uterine segment. A low transverse uterine incision was made. Delivered from vertex presentation , delee suctioned then delivered a baby with Apgar scores of 9 at one minute and 9 at five minutes. After the umbilical cord was clamped and cut cord blood was obtained for evaluation. The placenta was removed intact and appeared normal. The uterine outline, tubes and ovaries appeared normal. Irrigation was performed.  The uterine incision was closed with running locked sutures of 0 monocryl and imbricated with 0 monocryl. Hemostasis was observed. Lavage was carried out until clear. The peritoneum was then closed with 0 monocryl and rectus muscles plicated in the midline.  After hemostasis was assured, the fascia was then reapproximated with running  sutures of 0 Vicryl. Irrigation was applied and after adequate hemostasis was assured, the skin was reapproximated with staples.  Instrument, sponge, and needle counts were correct prior the abdominal closure and at the conclusion of the case. The patient received clindamycin and gentamicin preoperatively.  Findings: Viable female, ph art 7.11  Estimated Blood Loss:  500cc         Specimens: Placenta was sent to pathology         Complications:  None

## 2012-08-06 NOTE — Transfer of Care (Signed)
Immediate Anesthesia Transfer of Care Note  Patient: Patty Bell  Procedure(s) Performed: Procedure(s): CESAREAN SECTION (N/A)  Patient Location: PACU  Anesthesia Type:Epidural  Level of Consciousness: awake, alert , oriented and patient cooperative  Airway & Oxygen Therapy: Patient Spontanous Breathing  Post-op Assessment: Report given to PACU RN and Post -op Vital signs reviewed and stable  Post vital signs: Reviewed and stable  Complications: No apparent anesthesia complications

## 2012-08-06 NOTE — H&P (Signed)
Patty Bell is a 37 y.o. female presenting for labor.  Pregnancy uncomplicated except AMA with normal materni 21.  GBS -.. History OB History   Grav Para Term Preterm Abortions TAB SAB Ect Mult Living   1              Past Medical History  Diagnosis Date  . Asthma     as a child  . 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 Plantar fasciitis   . Scoliosis     bracing   Past Surgical History  Procedure Laterality Date  . No past surgeries     Family History: family history includes Heart disease in an unspecified family member; Hyperlipidemia in an unspecified family member; Hypertension in an unspecified family member; and Stroke in an unspecified family member. Social History:  reports that she has never smoked. She does not have any smokeless tobacco history on file. She reports that  drinks alcohol. She reports that she does not use illicit drugs.   Prenatal Transfer Tool  Maternal Diabetes: No Genetic Screening: Normal Maternal Ultrasounds/Referrals: Normal Fetal Ultrasounds or other Referrals:  None Maternal Substance Abuse:  No Significant Maternal Medications:  None Significant Maternal Lab Results:  None Other Comments:  None  ROS  Dilation: 10 Effacement (%): 100 Station: -1 Exam by:: E Bray RN AROM with thick meconium Blood pressure 102/82, pulse 76, temperature 97 F (36.1 C), temperature source Oral, resp. rate 20, height 5\' 10"  (1.778 m), weight 96.616 kg (213 lb), SpO2 100.00%. Exam Physical Exam  Prenatal labs: ABO, Rh: O/Positive/-- (12/13 0000) Antibody:   Rubella: Immune (06/13 0000) RPR: Nonreactive (06/13 0000)  HBsAg: Negative (06/13 0000)  HIV: Non-reactive (01/13 0000)  GBS: Negative (02/13 0000)   Assessment/Plan: IUP at term in active labor Anticipate SVD   Kross Swallows C 08/06/2012, 4:16 AM

## 2012-08-06 NOTE — MAU Note (Signed)
Pt states contractions started around 11pm and are about every 1-2 minutes. Denies any leaking of fluid

## 2012-08-07 LAB — CBC
HCT: 29.3 % — ABNORMAL LOW (ref 36.0–46.0)
Hemoglobin: 10.3 g/dL — ABNORMAL LOW (ref 12.0–15.0)
MCH: 32.1 pg (ref 26.0–34.0)
MCV: 91.3 fL (ref 78.0–100.0)
RBC: 3.21 MIL/uL — ABNORMAL LOW (ref 3.87–5.11)

## 2012-08-07 NOTE — Progress Notes (Signed)
Subjective: Postpartum Day 1: Cesarean Delivery Patient reports tolerating PO.    Objective: Vital signs in last 24 hours: Temp:  [97.5 F (36.4 C)-98.7 F (37.1 C)] 97.8 F (36.6 C) (02/14 0600) Pulse Rate:  [65-87] 65 (02/14 0600) Resp:  [18-20] 18 (02/14 0600) BP: (94-105)/(58-69) 101/60 mmHg (02/14 0600) SpO2:  [94 %-98 %] 94 % (02/14 0600)  Physical Exam:  General: alert, cooperative and no distress Lochia: appropriate Uterine Fundus: firm Incision: healing well DVT Evaluation: No evidence of DVT seen on physical exam.   Recent Labs  08/06/12 0307 08/07/12 0530  HGB 13.3 10.3*  HCT 37.2 29.3*    Assessment/Plan: Status post Cesarean section. Postoperative course complicated by unable to void yet.  Had in/out cath x 1. If unable to void again will replace foley to straight drain.  Continue current care.  Esmond Hinch II,Nikkole Placzek E 08/07/2012, 9:37 AM

## 2012-08-08 ENCOUNTER — Encounter (HOSPITAL_COMMUNITY): Payer: Self-pay | Admitting: Obstetrics and Gynecology

## 2012-08-08 LAB — CBC
HCT: 29.7 % — ABNORMAL LOW (ref 36.0–46.0)
MCHC: 34.3 g/dL (ref 30.0–36.0)
MCV: 91.4 fL (ref 78.0–100.0)
RDW: 12.1 % (ref 11.5–15.5)

## 2012-08-08 NOTE — Progress Notes (Signed)
Subjective: Postpartum Day 2: Cesarean Delivery Patient reports tolerating PO.    Objective: Vital signs in last 24 hours: Temp:  [97.5 F (36.4 C)-98.6 F (37 C)] 97.5 F (36.4 C) (02/15 0620) Pulse Rate:  [68-75] 68 (02/15 0620) Resp:  [18-20] 18 (02/15 0620) BP: (114-120)/(79-81) 114/81 mmHg (02/15 0620) SpO2:  [100 %] 100 % (02/15 0620)  Physical Exam:  General: alert, cooperative and no distress Lochia: appropriate Uterine Fundus: firm Incision: healing well DVT Evaluation: No evidence of DVT seen on physical exam.   Recent Labs  08/06/12 0307 08/07/12 0530  HGB 13.3 10.3*  HCT 37.2 29.3*    Assessment/Plan: Status post Cesarean section. Doing well postoperatively.  Continue current care Check platelets today.  Railynn Ballo II,Chelisa Hennen E 08/08/2012, 10:14 AM

## 2012-08-09 ENCOUNTER — Encounter (HOSPITAL_COMMUNITY)
Admission: RE | Admit: 2012-08-09 | Discharge: 2012-08-09 | Disposition: A | Discharge status: Home / Self Care | Payer: 59 | Source: Ambulatory Visit | Attending: Obstetrics and Gynecology | Admitting: Obstetrics and Gynecology

## 2012-08-09 DIAGNOSIS — O923 Agalactia: Secondary | ICD-10-CM | POA: Insufficient documentation

## 2012-08-09 MED ORDER — IBUPROFEN 600 MG PO TABS: 600.0000 mg | ORAL_TABLET | Freq: Four times a day (QID) | ORAL | Status: DC

## 2012-08-09 MED ORDER — LANOLIN HYDROUS EX OINT: 1.0000 "application " | TOPICAL_OINTMENT | CUTANEOUS | Status: DC | PRN

## 2012-08-09 MED ORDER — OXYCODONE-ACETAMINOPHEN 5-325 MG PO TABS: 1.0000 | ORAL_TABLET | Freq: Four times a day (QID) | ORAL | Status: DC | PRN

## 2012-08-09 NOTE — Progress Notes (Signed)
Subjective: Postpartum Day 3: Cesarean Delivery Patient reports tolerating PO.    Objective: Vital signs in last 24 hours: Temp:  [97.5 F (36.4 C)-98.5 F (36.9 C)] 97.5 F (36.4 C) (02/16 0550) Pulse Rate:  [67-71] 71 (02/16 0550) Resp:  [18-20] 20 (02/16 0550) BP: (127-131)/(80-85) 131/85 mmHg (02/16 0550)  Physical Exam:  General: alert, cooperative and no distress Lochia: appropriate Uterine Fundus: firm Incision: healing well DVT Evaluation: No evidence of DVT seen on physical exam.   Recent Labs  08/07/12 0530 08/08/12 1034  HGB 10.3* 10.2*  HCT 29.3* 29.7*    Assessment/Plan: Status post Cesarean section. Doing well postoperatively.  Discharge home with standard precautions and return to clinic in 3-5 days for staple removal.  Worth Kober II,Jaxxson Cavanah E 08/09/2012, 6:51 AM

## 2012-08-09 NOTE — Discharge Summary (Signed)
Obstetric Discharge Summary Reason for Admission: onset of labor Prenatal Procedures: ultrasound Intrapartum Procedures: cesarean: low cervical, transverse Postpartum Procedures: none Complications-Operative and Postpartum: none Hemoglobin  Date Value Range Status  08/08/2012 10.2* 12.0 - 15.0 g/dL Final     HCT  Date Value Range Status  08/08/2012 29.7* 36.0 - 46.0 % Final    Physical Exam:  General: alert, cooperative and no distress Lochia: appropriate Uterine Fundus: firm Incision: healing well DVT Evaluation: No evidence of DVT seen on physical exam.  Discharge Diagnoses: Term Pregnancy-delivered  Discharge Information: Date: 08/09/2012 Activity: pelvic rest Diet: routine Medications: PNV, Ibuprofen and Percocet Condition: stable Instructions: refer to practice specific booklet Discharge to: home Follow-up Information   Call in 3 days to follow up.      Newborn Data: Live born female  Birth Weight: 7 lb 15.3 oz (3610 g) APGAR: 9, 9  Home with mother.  Jakeem Grape II,Gray Doering E 08/09/2012, 6:53 AM

## 2012-09-01 ENCOUNTER — Encounter (HOSPITAL_COMMUNITY): Admission: RE | Admit: 2012-09-01 | Payer: 59 | Source: Ambulatory Visit

## 2012-09-07 ENCOUNTER — Encounter (HOSPITAL_COMMUNITY)
Admission: RE | Admit: 2012-09-07 | Discharge: 2012-09-07 | Disposition: A | Discharge status: Home / Self Care | Payer: 59 | Source: Ambulatory Visit | Attending: Obstetrics and Gynecology | Admitting: Obstetrics and Gynecology

## 2012-09-07 DIAGNOSIS — O923 Agalactia: Secondary | ICD-10-CM | POA: Insufficient documentation

## 2012-10-08 ENCOUNTER — Encounter (HOSPITAL_COMMUNITY)
Admission: RE | Admit: 2012-10-08 | Discharge: 2012-10-08 | Disposition: A | Discharge status: Home / Self Care | Payer: 59 | Source: Ambulatory Visit | Attending: Obstetrics and Gynecology | Admitting: Obstetrics and Gynecology

## 2012-10-08 DIAGNOSIS — O923 Agalactia: Secondary | ICD-10-CM | POA: Insufficient documentation

## 2012-11-08 ENCOUNTER — Encounter (HOSPITAL_COMMUNITY)
Admission: RE | Admit: 2012-11-08 | Discharge: 2012-11-08 | Disposition: A | Discharge status: Home / Self Care | Payer: 59 | Source: Ambulatory Visit | Attending: Obstetrics and Gynecology | Admitting: Obstetrics and Gynecology

## 2012-11-08 DIAGNOSIS — O923 Agalactia: Secondary | ICD-10-CM | POA: Insufficient documentation

## 2012-12-09 ENCOUNTER — Encounter (HOSPITAL_COMMUNITY)
Admission: RE | Admit: 2012-12-09 | Discharge: 2012-12-09 | Disposition: A | Discharge status: Home / Self Care | Payer: 59 | Source: Ambulatory Visit | Attending: Obstetrics and Gynecology | Admitting: Obstetrics and Gynecology

## 2012-12-09 DIAGNOSIS — O923 Agalactia: Secondary | ICD-10-CM | POA: Insufficient documentation

## 2013-01-08 ENCOUNTER — Encounter (HOSPITAL_COMMUNITY)
Admission: RE | Admit: 2013-01-08 | Discharge: 2013-01-08 | Disposition: A | Discharge status: Home / Self Care | Payer: 59 | Source: Ambulatory Visit | Attending: Obstetrics and Gynecology | Admitting: Obstetrics and Gynecology

## 2013-01-08 DIAGNOSIS — O923 Agalactia: Secondary | ICD-10-CM | POA: Insufficient documentation

## 2013-02-08 ENCOUNTER — Encounter (HOSPITAL_COMMUNITY)
Admission: RE | Admit: 2013-02-08 | Discharge: 2013-02-08 | Disposition: A | Discharge status: Home / Self Care | Payer: 59 | Source: Ambulatory Visit | Attending: Obstetrics and Gynecology | Admitting: Obstetrics and Gynecology

## 2013-02-08 DIAGNOSIS — O923 Agalactia: Secondary | ICD-10-CM | POA: Insufficient documentation

## 2013-03-11 ENCOUNTER — Encounter (HOSPITAL_COMMUNITY)
Admission: RE | Admit: 2013-03-11 | Discharge: 2013-03-11 | Disposition: A | Discharge status: Home / Self Care | Payer: 59 | Source: Ambulatory Visit | Attending: Obstetrics and Gynecology | Admitting: Obstetrics and Gynecology

## 2013-03-11 DIAGNOSIS — O923 Agalactia: Secondary | ICD-10-CM | POA: Insufficient documentation

## 2013-03-18 ENCOUNTER — Other Ambulatory Visit (INDEPENDENT_AMBULATORY_CARE_PROVIDER_SITE_OTHER): Payer: 59

## 2013-03-18 ENCOUNTER — Other Ambulatory Visit: Payer: 59

## 2013-03-18 DIAGNOSIS — Z0000 Encounter for general adult medical examination without abnormal findings: Secondary | ICD-10-CM

## 2013-03-18 LAB — HEPATIC FUNCTION PANEL
ALT: 18 U/L (ref 0–35)
AST: 18 U/L (ref 0–37)
Alkaline Phosphatase: 78 U/L (ref 39–117)
Bilirubin, Direct: 0 mg/dL (ref 0.0–0.3)
Total Protein: 6.7 g/dL (ref 6.0–8.3)

## 2013-03-18 LAB — CBC WITH DIFFERENTIAL/PLATELET
Basophils Absolute: 0 10*3/uL (ref 0.0–0.1)
Eosinophils Relative: 2.9 % (ref 0.0–5.0)
MCV: 93.2 fl (ref 78.0–100.0)
Monocytes Absolute: 0.3 10*3/uL (ref 0.1–1.0)
Monocytes Relative: 6.4 % (ref 3.0–12.0)
Neutrophils Relative %: 55.1 % (ref 43.0–77.0)
Platelets: 175 10*3/uL (ref 150.0–400.0)
RDW: 12.1 % (ref 11.5–14.6)
WBC: 4.1 10*3/uL — ABNORMAL LOW (ref 4.5–10.5)

## 2013-03-18 LAB — LIPID PANEL
Cholesterol: 199 mg/dL (ref 0–200)
LDL Cholesterol: 102 mg/dL — ABNORMAL HIGH (ref 0–99)
Triglycerides: 28 mg/dL (ref 0.0–149.0)

## 2013-03-18 LAB — BASIC METABOLIC PANEL
BUN: 11 mg/dL (ref 6–23)
Creatinine, Ser: 0.7 mg/dL (ref 0.4–1.2)
GFR: 93.9 mL/min (ref >60.00)
Glucose, Bld: 93 mg/dL (ref 70–99)

## 2013-03-26 ENCOUNTER — Encounter: Payer: Self-pay | Admitting: Internal Medicine

## 2013-03-26 ENCOUNTER — Ambulatory Visit (INDEPENDENT_AMBULATORY_CARE_PROVIDER_SITE_OTHER): Payer: 59 | Admitting: Internal Medicine

## 2013-03-26 VITALS — BP 108/66 | HR 76 | Temp 98.2°F | Ht 69.75 in | Wt 187.0 lb

## 2013-03-26 DIAGNOSIS — Z0000 Encounter for general adult medical examination without abnormal findings: Secondary | ICD-10-CM

## 2013-03-26 NOTE — Progress Notes (Signed)
Chief Complaint  Patient presents with  . Annual Exam    HPI: Patient comes in today for Preventive Health Care visit   c section 2 13 14   Still trying to give breast milk pumping milk small amounts of formula infant is healthy husband has had some job situation difficulties but generally everything is well trying to eat healthier and been exercising as much. ROS:  GEN/ HEENT: No fever, significant weight changes sweats headaches vision problems hearing changes, CV/ PULM; No chest pain shortness of breath cough, syncope,edema  change in exercise tolerance. GI /GU: No adominal pain, vomiting, change in bowel habits. No blood in the stool. No significant GU symptoms. SKIN/HEME: ,no acute skin rashes suspicious lesions or bleeding. No lymphadenopathy, nodules, masses.  NEURO/ PSYCH:  No neurologic signs such as weakness has a numb area on the right lateral leg above the knee that occurs at night and it itches in the day but no associated weakness radiation and back pain other neurologic changes this is been going on a month or 2. No depression anxiety. IMM/ Allergy: No unusual infections.  Allergy .   REST of 12 system review negative except as per HPI   Past Medical History  Diagnosis Date  . Asthma     as a child  . 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 Plantar fasciitis   . Scoliosis     bracing    Family History  Problem Relation Age of Onset  . Hyperlipidemia      parent and grandparent  . Hypertension    . Heart disease    . Stroke      History   Social History  . Marital Status: Married    Spouse Name: N/A    Number of Children: N/A  . Years of Education: N/A   Social History Main Topics  . Smoking status: Never Smoker   . Smokeless tobacco: None  . Alcohol Use: Yes  . Drug Use: No  . Sexual Activity: None   Other Topics Concern  . 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 ibuprofen (ADVIL,MOTRIN) 600 MG tablet Take 1 tablet (600 mg total) by mouth every 6 (six) hours.  30 tablet  2  . Prenatal Vit-Fe Fumarate-FA (PRENATAL MULTIVITAMIN) TABS Take 1 tablet by mouth daily.      . [DISCONTINUED] lanolin OINT Apply 1 application topically as needed (for breast care).  500 g  2  . [DISCONTINUED] oxyCODONE-acetaminophen (PERCOCET/ROXICET) 5-325 MG per tablet Take 1-2 tablets by mouth every 6 (six) hours as needed.  30 tablet  0   No facility-administered encounter medications on file as of 03/26/2013.    EXAM:  BP 108/66  Pulse 76  Temp(Src) 98.2 F (36.8 C) (Oral)  Ht 5' 9.75" (1.772 m)  Wt 187 lb (84.823 kg)  BMI 27.01 kg/m2  SpO2 98%  Breastfeeding? Yes  Body mass index is 27.01 kg/(m^2).  Physical Exam: Vital signs reviewed WUX:LKGM is a well-developed well-nourished alert cooperative   female who appears her stated age in no acute distress.  HEENT: normocephalic atraumatic , Eyes:  PERRL EOM's full, conjunctiva clear, Nares: paten,t no deformity discharge or tenderness., Ears: no deformity EAC's clear TMs with normal landmarks. Mouth: clear OP, no lesions, edema.  Moist mucous membranes. Dentition in adequate repair. NECK: supple without masses, thyromegaly or bruits. CHEST/PULM:  Clear to auscultation and percussion breath sounds equal no wheeze , rales or rhonchi. No chest wall deformities or tenderness. CV: PMI is nondisplaced, S1 S2 no gallops, murmurs, rubs. Peripheral pulses are full without delay.No JVD .  ABDOMEN: Bowel sounds normal nontender  No guard or rebound, no hepato splenomegal no CVA tenderness.  No hernia. Extremtities:  No clubbing cyanosis or edema, no acute joint swelling or redness no focal atrophy NEURO:  Oriented x3, cranial nerves 3-12 appear to be  intact, no obvious focal weakness,gait within normal limits no abnormal reflexes or asymmetrical SKIN: No acute rashes normal turgor, color, no bruising or petechiae. PSYCH: Oriented, good eye contact, no obvious depression anxiety, cognition and judgment appear normal. LN: no cervical axillary inguinal adenopathy  Lab Results  Component Value Date   WBC 4.1* 03/18/2013   HGB 13.5 03/18/2013   HCT 39.8 03/18/2013   PLT 175.0 03/18/2013   GLUCOSE 93 03/18/2013   CHOL 199 03/18/2013   TRIG 28.0 03/18/2013   HDL 91.80 03/18/2013   LDLDIRECT 103.4 05/14/2011   LDLCALC 102* 03/18/2013   ALT 18 03/18/2013   AST 18 03/18/2013   NA 140 03/18/2013   K 4.0 03/18/2013   CL 109 03/18/2013   CREATININE 0.7 03/18/2013   BUN 11 03/18/2013   CO2 25 03/18/2013   TSH 0.75 03/18/2013    ASSESSMENT AND PLAN:  Discussed the following assessment and plan:  Visit for preventive health examination  Lactating mother Counseled regarding healthy nutrition, exercise, sleep, injury prevention, calcium vit d and healthy weight .  Patient Care Team: Madelin Headings, MD as PCP - General (Internal Medicine) Loreta Ave, MD (Orthopedic Surgery) Meriel Pica, MD as Consulting Physician (Obstetrics and Gynecology) Patient Instructions  Continue lifestyle intervention healthy eating and exercise . i think the intermittent numbness on sid eof rightleg may be a compression issue  Fu if  persistent or progressive concerns.  Labs are great.  Get you flu vaccine . Usually do pap smears every 3 years  i f normal. Make sure  You are getting enough vitamin D   Preventive Care for Adults, Female A healthy lifestyle and preventive care can promote health and wellness. Preventive health guidelines for women include the following key practices.  A routine yearly physical is a good way to check with your caregiver about your health and preventive screening. It is a chance to share any concerns and updates on your health,  and to receive a thorough exam.  Visit your dentist for a routine exam and preventive care every 6 months. Brush your teeth twice a day and floss once a day. Good oral hygiene prevents tooth decay and gum disease.  The frequency of eye exams is based on your age, health, family medical history, use of contact lenses, and other factors. Follow your caregiver's recommendations for frequency of eye exams.  Eat a healthy diet. Foods like vegetables, fruits, whole grains, low-fat dairy products, and lean protein foods contain the nutrients you need without too many calories. Decrease your intake of foods high in solid fats, added sugars, and salt. Eat the right amount of calories for you.Get information about a proper diet from your caregiver, if necessary.  Regular physical exercise  is one of the most important things you can do for your health. Most adults should get at least 150 minutes of moderate-intensity exercise (any activity that increases your heart rate and causes you to sweat) each week. In addition, most adults need muscle-strengthening exercises on 2 or more days a week.  Maintain a healthy weight. The body mass index (BMI) is a screening tool to identify possible weight problems. It provides an estimate of body fat based on height and weight. Your caregiver can help determine your BMI, and can help you achieve or maintain a healthy weight.For adults 20 years and older:  A BMI below 18.5 is considered underweight.  A BMI of 18.5 to 24.9 is normal.  A BMI of 25 to 29.9 is considered overweight.  A BMI of 30 and above is considered obese.  Maintain normal blood lipids and cholesterol levels by exercising and minimizing your intake of saturated fat. Eat a balanced diet with plenty of fruit and vegetables. Blood tests for lipids and cholesterol should begin at age 8 and be repeated every 5 years. If your lipid or cholesterol levels are high, you are over 50, or you are at high risk for  heart disease, you may need your cholesterol levels checked more frequently.Ongoing high lipid and cholesterol levels should be treated with medicines if diet and exercise are not effective.  If you smoke, find out from your caregiver how to quit. If you do not use tobacco, do not start.  If you are pregnant, do not drink alcohol. If you are breastfeeding, be very cautious about drinking alcohol. If you are not pregnant and choose to drink alcohol, do not exceed 1 drink per day. One drink is considered to be 12 ounces (355 mL) of beer, 5 ounces (148 mL) of wine, or 1.5 ounces (44 mL) of liquor.  Avoid use of street drugs. Do not share needles with anyone. Ask for help if you need support or instructions about stopping the use of drugs.  High blood pressure causes heart disease and increases the risk of stroke. Your blood pressure should be checked at least every 1 to 2 years. Ongoing high blood pressure should be treated with medicines if weight loss and exercise are not effective.  If you are 68 to 37 years old, ask your caregiver if you should take aspirin to prevent strokes.  Diabetes screening involves taking a blood sample to check your fasting blood sugar level. This should be done once every 3 years, after age 92, if you are within normal weight and without risk factors for diabetes. Testing should be considered at a younger age or be carried out more frequently if you are overweight and have at least 1 risk factor for diabetes.  Breast cancer screening is essential preventive care for women. You should practice "breast self-awareness." This means understanding the normal appearance and feel of your breasts and may include breast self-examination. Any changes detected, no matter how small, should be reported to a caregiver. Women in their 33s and 30s should have a clinical breast exam (CBE) by a caregiver as part of a regular health exam every 1 to 3 years. After age 4, women should have a CBE  every year. Starting at age 76, women should consider having a mammography (breast X-ray test) every year. Women who have a family history of breast cancer should talk to their caregiver about genetic screening. Women at a high risk of breast cancer should talk to their caregivers about  having magnetic resonance imaging (MRI) and a mammography every year.  The Pap test is a screening test for cervical cancer. A Pap test can show cell changes on the cervix that might become cervical cancer if left untreated. A Pap test is a procedure in which cells are obtained and examined from the lower end of the uterus (cervix).  Women should have a Pap test starting at age 64.  Between ages 33 and 85, Pap tests should be repeated every 2 years.  Beginning at age 66, you should have a Pap test every 3 years as long as the past 3 Pap tests have been normal.  Some women have medical problems that increase the chance of getting cervical cancer. Talk to your caregiver about these problems. It is especially important to talk to your caregiver if a new problem develops soon after your last Pap test. In these cases, your caregiver may recommend more frequent screening and Pap tests.  The above recommendations are the same for women who have or have not gotten the vaccine for human papillomavirus (HPV).  If you had a hysterectomy for a problem that was not cancer or a condition that could lead to cancer, then you no longer need Pap tests. Even if you no longer need a Pap test, a regular exam is a good idea to make sure no other problems are starting.  If you are between ages 55 and 57, and you have had normal Pap tests going back 10 years, you no longer need Pap tests. Even if you no longer need a Pap test, a regular exam is a good idea to make sure no other problems are starting.  If you have had past treatment for cervical cancer or a condition that could lead to cancer, you need Pap tests and screening for cancer for  at least 20 years after your treatment.  If Pap tests have been discontinued, risk factors (such as a new sexual partner) need to be reassessed to determine if screening should be resumed.  The HPV test is an additional test that may be used for cervical cancer screening. The HPV test looks for the virus that can cause the cell changes on the cervix. The cells collected during the Pap test can be tested for HPV. The HPV test could be used to screen women aged 74 years and older, and should be used in women of any age who have unclear Pap test results. After the age of 59, women should have HPV testing at the same frequency as a Pap test.  Colorectal cancer can be detected and often prevented. Most routine colorectal cancer screening begins at the age of 61 and continues through age 32. However, your caregiver may recommend screening at an earlier age if you have risk factors for colon cancer. On a yearly basis, your caregiver may provide home test kits to check for hidden blood in the stool. Use of a small camera at the end of a tube, to directly examine the colon (sigmoidoscopy or colonoscopy), can detect the earliest forms of colorectal cancer. Talk to your caregiver about this at age 84, when routine screening begins. Direct examination of the colon should be repeated every 5 to 10 years through age 11, unless early forms of pre-cancerous polyps or small growths are found.  Hepatitis C blood testing is recommended for all people born from [birth year] through 1965 and any individual with known risks for hepatitis C.  Practice safe sex. Use condoms and avoid  high-risk sexual practices to reduce the spread of sexually transmitted infections (STIs). STIs include gonorrhea, chlamydia, syphilis, trichomonas, herpes, HPV, and human immunodeficiency virus (HIV). Herpes, HIV, and HPV are viral illnesses that have no cure. They can result in disability, cancer, and death. Sexually active women aged 33 and younger  should be checked for chlamydia. Older women with new or multiple partners should also be tested for chlamydia. Testing for other STIs is recommended if you are sexually active and at increased risk.  Osteoporosis is a disease in which the bones lose minerals and strength with aging. This can result in serious bone fractures. The risk of osteoporosis can be identified using a bone density scan. Women ages 27 and over and women at risk for fractures or osteoporosis should discuss screening with their caregivers. Ask your caregiver whether you should take a calcium supplement or vitamin D to reduce the rate of osteoporosis.  Menopause can be associated with physical symptoms and risks. Hormone replacement therapy is available to decrease symptoms and risks. You should talk to your caregiver about whether hormone replacement therapy is right for you.  Use sunscreen with sun protection factor (SPF) of 30 or more. Apply sunscreen liberally and repeatedly throughout the day. You should seek shade when your shadow is shorter than you. Protect yourself by wearing long sleeves, pants, a wide-brimmed hat, and sunglasses year round, whenever you are outdoors.  Once a month, do a whole body skin exam, using a mirror to look at the skin on your back. Notify your caregiver of new moles, moles that have irregular borders, moles that are larger than a pencil eraser, or moles that have changed in shape or color.  Stay current with required immunizations.  Influenza. You need a dose every fall (or winter). The composition of the flu vaccine changes each year, so being vaccinated once is not enough.  Pneumococcal polysaccharide. You need 1 to 2 doses if you smoke cigarettes or if you have certain chronic medical conditions. You need 1 dose at age 24 (or older) if you have never been vaccinated.  Tetanus, diphtheria, pertussis (Tdap, Td). Get 1 dose of Tdap vaccine if you are younger than age 70, are over 70 and have  contact with an infant, are a Research scientist (physical sciences), are pregnant, or simply want to be protected from whooping cough. After that, you need a Td booster dose every 10 years. Consult your caregiver if you have not had at least 3 tetanus and diphtheria-containing shots sometime in your life or have a deep or dirty wound.  HPV. You need this vaccine if you are a woman age 65 or younger. The vaccine is given in 3 doses over 6 months.  Measles, mumps, rubella (MMR). You need at least 1 dose of MMR if you were born in 1957 or later. You may also need a second dose.  Meningococcal. If you are age 98 to 30 and a first-year college student living in a residence hall, or have one of several medical conditions, you need to get vaccinated against meningococcal disease. You may also need additional booster doses.  Zoster (shingles). If you are age 18 or older, you should get this vaccine.  Varicella (chickenpox). If you have never had chickenpox or you were vaccinated but received only 1 dose, talk to your caregiver to find out if you need this vaccine.  Hepatitis A. You need this vaccine if you have a specific risk factor for hepatitis A virus infection or you simply  wish to be protected from this disease. The vaccine is usually given as 2 doses, 6 to 18 months apart.  Hepatitis B. You need this vaccine if you have a specific risk factor for hepatitis B virus infection or you simply wish to be protected from this disease. The vaccine is given in 3 doses, usually over 6 months. Preventive Services / Frequency Ages 36 to 70  Blood pressure check.** / Every 1 to 2 years.  Lipid and cholesterol check.** / Every 5 years beginning at age 75.  Clinical breast exam.** / Every 3 years for women in their 21s and 30s.  Pap test.** / Every 2 years from ages 29 through 54. Every 3 years starting at age 71 through age 41 or 41 with a history of 3 consecutive normal Pap tests.  HPV screening.** / Every 3 years from ages  46 through ages 74 to 42 with a history of 3 consecutive normal Pap tests.  Hepatitis C blood test.** / For any individual with known risks for hepatitis C.  Skin self-exam. / Monthly.  Influenza immunization.** / Every year.  Pneumococcal polysaccharide immunization.** / 1 to 2 doses if you smoke cigarettes or if you have certain chronic medical conditions.  Tetanus, diphtheria, pertussis (Tdap, Td) immunization. / A one-time dose of Tdap vaccine. After that, you need a Td booster dose every 10 years.  HPV immunization. / 3 doses over 6 months, if you are 25 and younger.  Measles, mumps, rubella (MMR) immunization. / You need at least 1 dose of MMR if you were born in 1957 or later. You may also need a second dose.  Meningococcal immunization. / 1 dose if you are age 66 to 79 and a first-year college student living in a residence hall, or have one of several medical conditions, you need to get vaccinated against meningococcal disease. You may also need additional booster doses.  Varicella immunization.** / Consult your caregiver.  Hepatitis A immunization.** / Consult your caregiver. 2 doses, 6 to 18 months apart.  Hepatitis B immunization.** / Consult your caregiver. 3 doses usually over 6 months. Ages 63 to 4  Blood pressure check.** / Every 1 to 2 years.  Lipid and cholesterol check.** / Every 5 years beginning at age 1.  Clinical breast exam.** / Every year after age 66.  Mammogram.** / Every year beginning at age 47 and continuing for as long as you are in good health. Consult with your caregiver.  Pap test.** / Every 3 years starting at age 58 through age 88 or 69 with a history of 3 consecutive normal Pap tests.  HPV screening.** / Every 3 years from ages 54 through ages 70 to 76 with a history of 3 consecutive normal Pap tests.  Fecal occult blood test (FOBT) of stool. / Every year beginning at age 29 and continuing until age 85. You may not need to do this test if you  get a colonoscopy every 10 years.  Flexible sigmoidoscopy or colonoscopy.** / Every 5 years for a flexible sigmoidoscopy or every 10 years for a colonoscopy beginning at age 82 and continuing until age 26.  Hepatitis C blood test.** / For all people born from [birth year] through 1965 and any individual with known risks for hepatitis C.  Skin self-exam. / Monthly.  Influenza immunization.** / Every year.  Pneumococcal polysaccharide immunization.** / 1 to 2 doses if you smoke cigarettes or if you have certain chronic medical conditions.  Tetanus, diphtheria, pertussis (Tdap, Td) immunization.** /  A one-time dose of Tdap vaccine. After that, you need a Td booster dose every 10 years.  Measles, mumps, rubella (MMR) immunization. / You need at least 1 dose of MMR if you were born in 1957 or later. You may also need a second dose.  Varicella immunization.** / Consult your caregiver.  Meningococcal immunization.** / Consult your caregiver.  Hepatitis A immunization.** / Consult your caregiver. 2 doses, 6 to 18 months apart.  Hepatitis B immunization.** / Consult your caregiver. 3 doses, usually over 6 months. Ages 44 and over  Blood pressure check.** / Every 1 to 2 years.  Lipid and cholesterol check.** / Every 5 years beginning at age 59.  Clinical breast exam.** / Every year after age 64.  Mammogram.** / Every year beginning at age 60 and continuing for as long as you are in good health. Consult with your caregiver.  Pap test.** / Every 3 years starting at age 4 through age 14 or 84 with a 3 consecutive normal Pap tests. Testing can be stopped between 65 and 70 with 3 consecutive normal Pap tests and no abnormal Pap or HPV tests in the past 10 years.  HPV screening.** / Every 3 years from ages 85 through ages 78 or 22 with a history of 3 consecutive normal Pap tests. Testing can be stopped between 65 and 70 with 3 consecutive normal Pap tests and no abnormal Pap or HPV tests in the past 10  years.  Fecal occult blood test (FOBT) of stool. / Every year beginning at age 63 and continuing until age 25. You may not need to do this test if you get a colonoscopy every 10 years.  Flexible sigmoidoscopy or colonoscopy.** / Every 5 years for a flexible sigmoidoscopy or every 10 years for a colonoscopy beginning at age 39 and continuing until age 59.  Hepatitis C blood test.** / For all people born from [birth year] through 1965 and any individual with known risks for hepatitis C.  Osteoporosis screening.** / A one-time screening for women ages 57 and over and women at risk for fractures or osteoporosis.  Skin self-exam. / Monthly.  Influenza immunization.** / Every year.  Pneumococcal polysaccharide immunization.** / 1 dose at age 28 (or older) if you have never been vaccinated.  Tetanus, diphtheria, pertussis (Tdap, Td) immunization. / A one-time dose of Tdap vaccine if you are over 65 and have contact with an infant, are a Research scientist (physical sciences), or simply want to be protected from whooping cough. After that, you need a Td booster dose every 10 years.  Varicella immunization.** / Consult your caregiver.  Meningococcal immunization.** / Consult your caregiver.  Hepatitis A immunization.** / Consult your caregiver. 2 doses, 6 to 18 months apart.  Hepatitis B immunization.** / Check with your caregiver. 3 doses, usually over 6 months. ** Family history and personal history of risk and conditions may change your caregiver's recommendations. Document Released: 08/06/2001 Document Revised: 09/02/2011 Document Reviewed: 11/05/2010 [facility] Patient Information 2014 [place], [place].      Neta Mends. Angle Karel M.D.  Health Maintenance  Topic Date Due  . Influenza Vaccine  02/22/2014  . Pap Smear  09/15/2015  . Tetanus/tdap  06/12/2022   Health Maintenance Review }

## 2013-03-26 NOTE — Patient Instructions (Signed)
Continue lifestyle intervention healthy eating and exercise . i think the intermittent numbness on sid eof rightleg may be a compression issue  Fu if  persistent or progressive concerns.  Labs are great.  Get you flu vaccine . Usually do pap smears every 3 years  i f normal. Make sure  You are getting enough vitamin D   Preventive Care for Adults, Female A healthy lifestyle and preventive care can promote health and wellness. Preventive health guidelines for women include the following key practices.  A routine yearly physical is a good way to check with your caregiver about your health and preventive screening. It is a chance to share any concerns and updates on your health, and to receive a thorough exam.  Visit your dentist for a routine exam and preventive care every 6 months. Brush your teeth twice a day and floss once a day. Good oral hygiene prevents tooth decay and gum disease.  The frequency of eye exams is based on your age, health, family medical history, use of contact lenses, and other factors. Follow your caregiver's recommendations for frequency of eye exams.  Eat a healthy diet. Foods like vegetables, fruits, whole grains, low-fat dairy products, and lean protein foods contain the nutrients you need without too many calories. Decrease your intake of foods high in solid fats, added sugars, and salt. Eat the right amount of calories for you.Get information about a proper diet from your caregiver, if necessary.  Regular physical exercise is one of the most important things you can do for your health. Most adults should get at least 150 minutes of moderate-intensity exercise (any activity that increases your heart rate and causes you to sweat) each week. In addition, most adults need muscle-strengthening exercises on 2 or more days a week.  Maintain a healthy weight. The body mass index (BMI) is a screening tool to identify possible weight problems. It provides an estimate of body  fat based on height and weight. Your caregiver can help determine your BMI, and can help you achieve or maintain a healthy weight.For adults 20 years and older:  A BMI below 18.5 is considered underweight.  A BMI of 18.5 to 24.9 is normal.  A BMI of 25 to 29.9 is considered overweight.  A BMI of 30 and above is considered obese.  Maintain normal blood lipids and cholesterol levels by exercising and minimizing your intake of saturated fat. Eat a balanced diet with plenty of fruit and vegetables. Blood tests for lipids and cholesterol should begin at age 35 and be repeated every 5 years. If your lipid or cholesterol levels are high, you are over 50, or you are at high risk for heart disease, you may need your cholesterol levels checked more frequently.Ongoing high lipid and cholesterol levels should be treated with medicines if diet and exercise are not effective.  If you smoke, find out from your caregiver how to quit. If you do not use tobacco, do not start.  If you are pregnant, do not drink alcohol. If you are breastfeeding, be very cautious about drinking alcohol. If you are not pregnant and choose to drink alcohol, do not exceed 1 drink per day. One drink is considered to be 12 ounces (355 mL) of beer, 5 ounces (148 mL) of wine, or 1.5 ounces (44 mL) of liquor.  Avoid use of street drugs. Do not share needles with anyone. Ask for help if you need support or instructions about stopping the use of drugs.  High blood  pressure causes heart disease and increases the risk of stroke. Your blood pressure should be checked at least every 1 to 2 years. Ongoing high blood pressure should be treated with medicines if weight loss and exercise are not effective.  If you are 27 to 37 years old, ask your caregiver if you should take aspirin to prevent strokes.  Diabetes screening involves taking a blood sample to check your fasting blood sugar level. This should be done once every 3 years, after age 8,  if you are within normal weight and without risk factors for diabetes. Testing should be considered at a younger age or be carried out more frequently if you are overweight and have at least 1 risk factor for diabetes.  Breast cancer screening is essential preventive care for women. You should practice "breast self-awareness." This means understanding the normal appearance and feel of your breasts and may include breast self-examination. Any changes detected, no matter how small, should be reported to a caregiver. Women in their 17s and 30s should have a clinical breast exam (CBE) by a caregiver as part of a regular health exam every 1 to 3 years. After age 58, women should have a CBE every year. Starting at age 22, women should consider having a mammography (breast X-ray test) every year. Women who have a family history of breast cancer should talk to their caregiver about genetic screening. Women at a high risk of breast cancer should talk to their caregivers about having magnetic resonance imaging (MRI) and a mammography every year.  The Pap test is a screening test for cervical cancer. A Pap test can show cell changes on the cervix that might become cervical cancer if left untreated. A Pap test is a procedure in which cells are obtained and examined from the lower end of the uterus (cervix).  Women should have a Pap test starting at age 75.  Between ages 48 and 40, Pap tests should be repeated every 2 years.  Beginning at age 60, you should have a Pap test every 3 years as long as the past 3 Pap tests have been normal.  Some women have medical problems that increase the chance of getting cervical cancer. Talk to your caregiver about these problems. It is especially important to talk to your caregiver if a new problem develops soon after your last Pap test. In these cases, your caregiver may recommend more frequent screening and Pap tests.  The above recommendations are the same for women who have  or have not gotten the vaccine for human papillomavirus (HPV).  If you had a hysterectomy for a problem that was not cancer or a condition that could lead to cancer, then you no longer need Pap tests. Even if you no longer need a Pap test, a regular exam is a good idea to make sure no other problems are starting.  If you are between ages 70 and 47, and you have had normal Pap tests going back 10 years, you no longer need Pap tests. Even if you no longer need a Pap test, a regular exam is a good idea to make sure no other problems are starting.  If you have had past treatment for cervical cancer or a condition that could lead to cancer, you need Pap tests and screening for cancer for at least 20 years after your treatment.  If Pap tests have been discontinued, risk factors (such as a new sexual partner) need to be reassessed to determine if screening should  be resumed.  The HPV test is an additional test that may be used for cervical cancer screening. The HPV test looks for the virus that can cause the cell changes on the cervix. The cells collected during the Pap test can be tested for HPV. The HPV test could be used to screen women aged 58 years and older, and should be used in women of any age who have unclear Pap test results. After the age of 88, women should have HPV testing at the same frequency as a Pap test.  Colorectal cancer can be detected and often prevented. Most routine colorectal cancer screening begins at the age of 42 and continues through age 20. However, your caregiver may recommend screening at an earlier age if you have risk factors for colon cancer. On a yearly basis, your caregiver may provide home test kits to check for hidden blood in the stool. Use of a small camera at the end of a tube, to directly examine the colon (sigmoidoscopy or colonoscopy), can detect the earliest forms of colorectal cancer. Talk to your caregiver about this at age 41, when routine screening begins.  Direct examination of the colon should be repeated every 5 to 10 years through age 21, unless early forms of pre-cancerous polyps or small growths are found.  Hepatitis C blood testing is recommended for all people born from [birth year] through 1965 and any individual with known risks for hepatitis C.  Practice safe sex. Use condoms and avoid high-risk sexual practices to reduce the spread of sexually transmitted infections (STIs). STIs include gonorrhea, chlamydia, syphilis, trichomonas, herpes, HPV, and human immunodeficiency virus (HIV). Herpes, HIV, and HPV are viral illnesses that have no cure. They can result in disability, cancer, and death. Sexually active women aged 58 and younger should be checked for chlamydia. Older women with new or multiple partners should also be tested for chlamydia. Testing for other STIs is recommended if you are sexually active and at increased risk.  Osteoporosis is a disease in which the bones lose minerals and strength with aging. This can result in serious bone fractures. The risk of osteoporosis can be identified using a bone density scan. Women ages 86 and over and women at risk for fractures or osteoporosis should discuss screening with their caregivers. Ask your caregiver whether you should take a calcium supplement or vitamin D to reduce the rate of osteoporosis.  Menopause can be associated with physical symptoms and risks. Hormone replacement therapy is available to decrease symptoms and risks. You should talk to your caregiver about whether hormone replacement therapy is right for you.  Use sunscreen with sun protection factor (SPF) of 30 or more. Apply sunscreen liberally and repeatedly throughout the day. You should seek shade when your shadow is shorter than you. Protect yourself by wearing long sleeves, pants, a wide-brimmed hat, and sunglasses year round, whenever you are outdoors.  Once a month, do a whole body skin exam, using a mirror to look at the skin  on your back. Notify your caregiver of new moles, moles that have irregular borders, moles that are larger than a pencil eraser, or moles that have changed in shape or color.  Stay current with required immunizations.  Influenza. You need a dose every fall (or winter). The composition of the flu vaccine changes each year, so being vaccinated once is not enough.  Pneumococcal polysaccharide. You need 1 to 2 doses if you smoke cigarettes or if you have certain chronic medical conditions. You need  1 dose at age 56 (or older) if you have never been vaccinated.  Tetanus, diphtheria, pertussis (Tdap, Td). Get 1 dose of Tdap vaccine if you are younger than age 60, are over 37 and have contact with an infant, are a Research scientist (physical sciences), are pregnant, or simply want to be protected from whooping cough. After that, you need a Td booster dose every 10 years. Consult your caregiver if you have not had at least 3 tetanus and diphtheria-containing shots sometime in your life or have a deep or dirty wound.  HPV. You need this vaccine if you are a woman age 40 or younger. The vaccine is given in 3 doses over 6 months.  Measles, mumps, rubella (MMR). You need at least 1 dose of MMR if you were born in 1957 or later. You may also need a second dose.  Meningococcal. If you are age 76 to 27 and a first-year college student living in a residence hall, or have one of several medical conditions, you need to get vaccinated against meningococcal disease. You may also need additional booster doses.  Zoster (shingles). If you are age 23 or older, you should get this vaccine.  Varicella (chickenpox). If you have never had chickenpox or you were vaccinated but received only 1 dose, talk to your caregiver to find out if you need this vaccine.  Hepatitis A. You need this vaccine if you have a specific risk factor for hepatitis A virus infection or you simply wish to be protected from this disease. The vaccine is usually given as  2 doses, 6 to 18 months apart.  Hepatitis B. You need this vaccine if you have a specific risk factor for hepatitis B virus infection or you simply wish to be protected from this disease. The vaccine is given in 3 doses, usually over 6 months. Preventive Services / Frequency Ages 56 to 44  Blood pressure check.** / Every 1 to 2 years.  Lipid and cholesterol check.** / Every 5 years beginning at age 12.  Clinical breast exam.** / Every 3 years for women in their 64s and 30s.  Pap test.** / Every 2 years from ages 26 through 70. Every 3 years starting at age 53 through age 21 or 71 with a history of 3 consecutive normal Pap tests.  HPV screening.** / Every 3 years from ages 23 through ages 3 to 64 with a history of 3 consecutive normal Pap tests.  Hepatitis C blood test.** / For any individual with known risks for hepatitis C.  Skin self-exam. / Monthly.  Influenza immunization.** / Every year.  Pneumococcal polysaccharide immunization.** / 1 to 2 doses if you smoke cigarettes or if you have certain chronic medical conditions.  Tetanus, diphtheria, pertussis (Tdap, Td) immunization. / A one-time dose of Tdap vaccine. After that, you need a Td booster dose every 10 years.  HPV immunization. / 3 doses over 6 months, if you are 22 and younger.  Measles, mumps, rubella (MMR) immunization. / You need at least 1 dose of MMR if you were born in 1957 or later. You may also need a second dose.  Meningococcal immunization. / 1 dose if you are age 29 to 43 and a first-year college student living in a residence hall, or have one of several medical conditions, you need to get vaccinated against meningococcal disease. You may also need additional booster doses.  Varicella immunization.** / Consult your caregiver.  Hepatitis A immunization.** / Consult your caregiver. 2 doses, 6 to 18  months apart.  Hepatitis B immunization.** / Consult your caregiver. 3 doses usually over 6 months. Ages 15 to  26  Blood pressure check.** / Every 1 to 2 years.  Lipid and cholesterol check.** / Every 5 years beginning at age 67.  Clinical breast exam.** / Every year after age 28.  Mammogram.** / Every year beginning at age 60 and continuing for as long as you are in good health. Consult with your caregiver.  Pap test.** / Every 3 years starting at age 26 through age 86 or 68 with a history of 3 consecutive normal Pap tests.  HPV screening.** / Every 3 years from ages 52 through ages 36 to 57 with a history of 3 consecutive normal Pap tests.  Fecal occult blood test (FOBT) of stool. / Every year beginning at age 88 and continuing until age 77. You may not need to do this test if you get a colonoscopy every 10 years.  Flexible sigmoidoscopy or colonoscopy.** / Every 5 years for a flexible sigmoidoscopy or every 10 years for a colonoscopy beginning at age 34 and continuing until age 38.  Hepatitis C blood test.** / For all people born from [birth year] through 1965 and any individual with known risks for hepatitis C.  Skin self-exam. / Monthly.  Influenza immunization.** / Every year.  Pneumococcal polysaccharide immunization.** / 1 to 2 doses if you smoke cigarettes or if you have certain chronic medical conditions.  Tetanus, diphtheria, pertussis (Tdap, Td) immunization.** / A one-time dose of Tdap vaccine. After that, you need a Td booster dose every 10 years.  Measles, mumps, rubella (MMR) immunization. / You need at least 1 dose of MMR if you were born in 1957 or later. You may also need a second dose.  Varicella immunization.** / Consult your caregiver.  Meningococcal immunization.** / Consult your caregiver.  Hepatitis A immunization.** / Consult your caregiver. 2 doses, 6 to 18 months apart.  Hepatitis B immunization.** / Consult your caregiver. 3 doses, usually over 6 months. Ages 85 and over  Blood pressure check.** / Every 1 to 2 years.  Lipid and cholesterol check.** / Every 5 years  beginning at age 49.  Clinical breast exam.** / Every year after age 15.  Mammogram.** / Every year beginning at age 50 and continuing for as long as you are in good health. Consult with your caregiver.  Pap test.** / Every 3 years starting at age 64 through age 36 or 6 with a 3 consecutive normal Pap tests. Testing can be stopped between 65 and 70 with 3 consecutive normal Pap tests and no abnormal Pap or HPV tests in the past 10 years.  HPV screening.** / Every 3 years from ages 20 through ages 65 or 36 with a history of 3 consecutive normal Pap tests. Testing can be stopped between 65 and 70 with 3 consecutive normal Pap tests and no abnormal Pap or HPV tests in the past 10 years.  Fecal occult blood test (FOBT) of stool. / Every year beginning at age 62 and continuing until age 82. You may not need to do this test if you get a colonoscopy every 10 years.  Flexible sigmoidoscopy or colonoscopy.** / Every 5 years for a flexible sigmoidoscopy or every 10 years for a colonoscopy beginning at age 53 and continuing until age 54.  Hepatitis C blood test.** / For all people born from [birth year] through 1965 and any individual with known risks for hepatitis C.  Osteoporosis screening.** / A one-time  screening for women ages 90 and over and women at risk for fractures or osteoporosis.  Skin self-exam. / Monthly.  Influenza immunization.** / Every year.  Pneumococcal polysaccharide immunization.** / 1 dose at age 36 (or older) if you have never been vaccinated.  Tetanus, diphtheria, pertussis (Tdap, Td) immunization. / A one-time dose of Tdap vaccine if you are over 65 and have contact with an infant, are a Research scientist (physical sciences), or simply want to be protected from whooping cough. After that, you need a Td booster dose every 10 years.  Varicella immunization.** / Consult your caregiver.  Meningococcal immunization.** / Consult your caregiver.  Hepatitis A immunization.** / Consult your caregiver. 2  doses, 6 to 18 months apart.  Hepatitis B immunization.** / Check with your caregiver. 3 doses, usually over 6 months. ** Family history and personal history of risk and conditions may change your caregiver's recommendations. Document Released: 08/06/2001 Document Revised: 09/02/2011 Document Reviewed: 11/05/2010 [facility] Patient Information 2014 [place], [place].

## 2013-04-11 ENCOUNTER — Encounter (HOSPITAL_COMMUNITY)
Admission: RE | Admit: 2013-04-11 | Discharge: 2013-04-11 | Disposition: A | Discharge status: Home / Self Care | Payer: 59 | Source: Ambulatory Visit | Attending: Obstetrics and Gynecology | Admitting: Obstetrics and Gynecology

## 2013-04-11 DIAGNOSIS — O923 Agalactia: Secondary | ICD-10-CM | POA: Insufficient documentation

## 2013-05-12 ENCOUNTER — Encounter (HOSPITAL_COMMUNITY)
Admission: RE | Admit: 2013-05-12 | Discharge: 2013-05-12 | Disposition: A | Discharge status: Home / Self Care | Payer: 59 | Source: Ambulatory Visit | Attending: Obstetrics and Gynecology | Admitting: Obstetrics and Gynecology

## 2013-05-12 DIAGNOSIS — O923 Agalactia: Secondary | ICD-10-CM | POA: Insufficient documentation

## 2013-06-12 ENCOUNTER — Encounter (HOSPITAL_COMMUNITY)
Admission: RE | Admit: 2013-06-12 | Discharge: 2013-06-12 | Disposition: A | Discharge status: Home / Self Care | Payer: 59 | Source: Ambulatory Visit | Attending: Obstetrics and Gynecology | Admitting: Obstetrics and Gynecology

## 2013-06-12 DIAGNOSIS — O923 Agalactia: Secondary | ICD-10-CM | POA: Insufficient documentation

## 2013-06-28 ENCOUNTER — Encounter: Payer: Self-pay | Admitting: Internal Medicine

## 2013-06-28 ENCOUNTER — Ambulatory Visit (INDEPENDENT_AMBULATORY_CARE_PROVIDER_SITE_OTHER): Payer: 59 | Admitting: Internal Medicine

## 2013-06-28 VITALS — BP 118/84 | HR 60 | Temp 98.3°F | Wt 198.0 lb

## 2013-06-28 DIAGNOSIS — J019 Acute sinusitis, unspecified: Secondary | ICD-10-CM

## 2013-06-28 DIAGNOSIS — J069 Acute upper respiratory infection, unspecified: Secondary | ICD-10-CM

## 2013-06-28 MED ORDER — CEFUROXIME AXETIL 500 MG PO TABS: 500.0000 mg | ORAL_TABLET | Freq: Two times a day (BID) | ORAL | Status: DC

## 2013-06-28 NOTE — Patient Instructions (Signed)
This appears to be a complicated resp infection. Treat for sinusitis  At this time Expect improvement in the next  3-5 days . Can use saline and decongestants   Sinusitis Sinusitis is redness, soreness, and swelling (inflammation) of the paranasal sinuses. Paranasal sinuses are air pockets within the bones of your face (beneath the eyes, the middle of the forehead, or above the eyes). In healthy paranasal sinuses, mucus is able to drain out, and air is able to circulate through them by way of your nose. However, when your paranasal sinuses are inflamed, mucus and air can become trapped. This can allow bacteria and other germs to grow and cause infection. Sinusitis can develop quickly and last only a short time (acute) or continue over a long period (chronic). Sinusitis that lasts for more than 12 weeks is considered chronic.  CAUSES  Causes of sinusitis include:  Allergies.  Structural abnormalities, such as displacement of the cartilage that separates your nostrils (deviated septum), which can decrease the air flow through your nose and sinuses and affect sinus drainage.  Functional abnormalities, such as when the small hairs (cilia) that line your sinuses and help remove mucus do not work properly or are not present. SYMPTOMS  Symptoms of acute and chronic sinusitis are the same. The primary symptoms are pain and pressure around the affected sinuses. Other symptoms include:  Upper toothache.  Earache.  Headache.  Bad breath.  Decreased sense of smell and taste.  A cough, which worsens when you are lying flat.  Fatigue.  Fever.  Thick drainage from your nose, which often is green and may contain pus (purulent).  Swelling and warmth over the affected sinuses. DIAGNOSIS  Your caregiver will perform a physical exam. During the exam, your caregiver may:  Look in your nose for signs of abnormal growths in your nostrils (nasal polyps).  Tap over the affected sinus to check for  signs of infection.  View the inside of your sinuses (endoscopy) with a special imaging device with a light attached (endoscope), which is inserted into your sinuses. If your caregiver suspects that you have chronic sinusitis, one or more of the following tests may be recommended:  Allergy tests.  Nasal culture A sample of mucus is taken from your nose and sent to a lab and screened for bacteria.  Nasal cytology A sample of mucus is taken from your nose and examined by your caregiver to determine if your sinusitis is related to an allergy. TREATMENT  Most cases of acute sinusitis are related to a viral infection and will resolve on their own within 10 days. Sometimes medicines are prescribed to help relieve symptoms (pain medicine, decongestants, nasal steroid sprays, or saline sprays).  However, for sinusitis related to a bacterial infection, your caregiver will prescribe antibiotic medicines. These are medicines that will help kill the bacteria causing the infection.  Rarely, sinusitis is caused by a fungal infection. In theses cases, your caregiver will prescribe antifungal medicine. For some cases of chronic sinusitis, surgery is needed. Generally, these are cases in which sinusitis recurs more than 3 times per year, despite other treatments. HOME CARE INSTRUCTIONS   Drink plenty of water. Water helps thin the mucus so your sinuses can drain more easily.  Use a humidifier.  Inhale steam 3 to 4 times a day (for example, sit in the bathroom with the shower running).  Apply a warm, moist washcloth to your face 3 to 4 times a day, or as directed by your caregiver.  Use saline nasal sprays to help moisten and clean your sinuses.  Take over-the-counter or prescription medicines for pain, discomfort, or fever only as directed by your caregiver. SEEK IMMEDIATE MEDICAL CARE IF:  You have increasing pain or severe headaches.  You have nausea, vomiting, or drowsiness.  You have swelling  around your face.  You have vision problems.  You have a stiff neck.  You have difficulty breathing. MAKE SURE YOU:   Understand these instructions.  Will watch your condition.  Will get help right away if you are not doing well or get worse. Document Released: 06/10/2005 Document Revised: 09/02/2011 Document Reviewed: 06/25/2011 [facility] Patient Information 2014 [place], [place].

## 2013-06-28 NOTE — Progress Notes (Signed)
Chief Complaint  Patient presents with  . Cough    sick for a month    HPI: Patient comes in today for SDA for  new problem evaluation. Sick since  Beginning of December .  Has 23 month old at home.  Cough and throat and raspy  Dry cough and then continued. Continued activities  And after x mas had increasing drainage for 3 day s and cough .  Became more severe and then drainage green and thick  . Still running  .last pm   Tooth ache  And pressure left face took advil .   Child in day care has had ear infections ROS: See pertinent positives and negatives per HPI. hxof rash on amox and another med uncertain which one uncertain if allergic to pcn.  No longer nursing  No cp sob   Past Medical History  Diagnosis Date  . Asthma     as a child  . 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 Plantar fasciitis   . Scoliosis     bracing    Family History  Problem Relation Age of Onset  . Hyperlipidemia      parent and grandparent  . Hypertension    . Heart disease    . Stroke      History   Social History  . Marital Status: Married    Spouse Name: N/A    Number of Children: N/A  . Years of Education: N/A   Social History Main Topics  . Smoking status: Never Smoker   . Smokeless tobacco: None  . Alcohol Use: Yes  . Drug Use: No  . Sexual Activity: None   Other Topics Concern  . 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 ibuprofen (ADVIL,MOTRIN) 600 MG tablet Take 1 tablet (600 mg total) by mouth every 6 (six) hours.  . cefUROXime (CEFTIN) 500 MG tablet Take 1 tablet (500 mg total) by mouth 2 (two) times daily.  . [DISCONTINUED] Prenatal Vit-Fe Fumarate-FA (PRENATAL MULTIVITAMIN) TABS Take 1 tablet by mouth daily.    EXAM:  BP  118/84  Pulse 60  Temp(Src) 98.3 F (36.8 C) (Oral)  Wt 198 lb (89.812 kg)  SpO2 99%  Body mass index is 28.6 kg/(m^2). WDWN in NAD  quiet respirations;  congested  somewhat hoarse. Non toxic . Looks tired  HEENT: Normocephalic ;atraumatic , Eyes;  PERRL, EOMs  Full, lids and conjunctiva clear,,Ears: no deformities, canals nl, TM landmarks normal, Nose: no deformity mucoid discharge  congested;face minimally tender Mouth : OP clear without lesion or edema . Neck: Supple without adenopathy or masses or bruits Chest:  Clear to A&P without wheezes rales or rhonchi CV:  S1-S2 no gallops or murmurs peripheral perfusion is normal Skin :nl perfusion and no acute rashes  MS: moves all extremities without noticeable focal  abnormality PSYCH: pleasant and cooperative, no obvious depression or anxiety  ASSESSMENT AND PLAN:  Discussed the following assessment and plan:  Acute sinusitis with symptoms greater than 10 days  Protracted URI 19 month old at home Rash with amox ?uncertain if from  Allergy Empiric rx fu  if  persistent or progressive  -Patient advised to return or notify health care team  if symptoms worsen or persist or new concerns arise.  Patient Instructions  This appears to be a complicated resp infection. Treat for sinusitis  At this time Expect improvement in the next  3-5 days . Can use saline and decongestants   Sinusitis Sinusitis is redness, soreness, and swelling (inflammation) of the paranasal sinuses. Paranasal sinuses are air pockets within the bones of your face (beneath the eyes, the middle of the forehead, or above the eyes). In healthy paranasal sinuses, mucus is able to drain out, and air is able to circulate through them by way of your nose. However, when your paranasal sinuses are inflamed, mucus and air can become trapped. This can allow bacteria and other germs to grow and cause infection. Sinusitis can develop quickly and last only a short time (acute) or  continue over a long period (chronic). Sinusitis that lasts for more than 12 weeks is considered chronic.  CAUSES  Causes of sinusitis include:  Allergies.  Structural abnormalities, such as displacement of the cartilage that separates your nostrils (deviated septum), which can decrease the air flow through your nose and sinuses and affect sinus drainage.  Functional abnormalities, such as when the small hairs (cilia) that line your sinuses and help remove mucus do not work properly or are not present. SYMPTOMS  Symptoms of acute and chronic sinusitis are the same. The primary symptoms are pain and pressure around the affected sinuses. Other symptoms include:  Upper toothache.  Earache.  Headache.  Bad breath.  Decreased sense of smell and taste.  A cough, which worsens when you are lying flat.  Fatigue.  Fever.  Thick drainage from your nose, which often is green and may contain pus (purulent).  Swelling and warmth over the affected sinuses. DIAGNOSIS  Your caregiver will perform a physical exam. During the exam, your caregiver may:  Look in your nose for signs of abnormal growths in your nostrils (nasal polyps).  Tap over the affected sinus to check for signs of infection.  View the inside of your sinuses (endoscopy) with a special imaging device with a light attached (endoscope), which is inserted into your sinuses. If your caregiver suspects that you have chronic sinusitis, one or more of the following tests may be recommended:  Allergy tests.  Nasal culture A sample of mucus is taken from your nose and sent to a lab and screened for bacteria.  Nasal cytology A sample of mucus is taken from your nose and examined by your caregiver to determine if your sinusitis is related to an allergy. TREATMENT  Most cases of acute sinusitis are related to a viral infection and will resolve on their own within 10 days. Sometimes medicines are prescribed to help relieve symptoms  (pain medicine, decongestants, nasal steroid sprays, or saline sprays).  However, for sinusitis related to a bacterial infection, your caregiver will prescribe antibiotic medicines. These are medicines that will help kill the bacteria causing the infection.  Rarely, sinusitis is caused by a fungal infection. In theses cases, your caregiver will prescribe antifungal medicine. For some cases of chronic sinusitis, surgery is needed. Generally, these are cases in which sinusitis recurs more than 3 times per year, despite other treatments. HOME CARE INSTRUCTIONS   Drink plenty of water. Water helps thin the mucus so your sinuses can drain more easily.  Use a humidifier.  Inhale steam 3 to 4 times a day (for example,  sit in the bathroom with the shower running).  Apply a warm, moist washcloth to your face 3 to 4 times a day, or as directed by your caregiver.  Use saline nasal sprays to help moisten and clean your sinuses.  Take over-the-counter or prescription medicines for pain, discomfort, or fever only as directed by your caregiver. SEEK IMMEDIATE MEDICAL CARE IF:  You have increasing pain or severe headaches.  You have nausea, vomiting, or drowsiness.  You have swelling around your face.  You have vision problems.  You have a stiff neck.  You have difficulty breathing. MAKE SURE YOU:   Understand these instructions.  Will watch your condition.  Will get help right away if you are not doing well or get worse. Document Released: 06/10/2005 Document Revised: 09/02/2011 Document Reviewed: 06/25/2011 [facility] Patient Information 2014 [place], [place].      Neta Mends. Jalynne Persico M.D.

## 2013-06-28 NOTE — Progress Notes (Signed)
Pre visit review using our clinic review tool, if applicable. No additional management support is needed unless otherwise documented below in the visit note. 

## 2013-08-09 ENCOUNTER — Ambulatory Visit (INDEPENDENT_AMBULATORY_CARE_PROVIDER_SITE_OTHER): Payer: 59 | Admitting: Internal Medicine

## 2013-08-09 ENCOUNTER — Encounter: Payer: Self-pay | Admitting: Internal Medicine

## 2013-08-09 ENCOUNTER — Telehealth: Payer: Self-pay | Admitting: Internal Medicine

## 2013-08-09 VITALS — BP 124/80 | HR 92 | Temp 98.7°F | Wt 201.0 lb

## 2013-08-09 DIAGNOSIS — B9789 Other viral agents as the cause of diseases classified elsewhere: Principal | ICD-10-CM

## 2013-08-09 DIAGNOSIS — J069 Acute upper respiratory infection, unspecified: Secondary | ICD-10-CM

## 2013-08-09 MED ORDER — HYDROCODONE-HOMATROPINE 5-1.5 MG/5ML PO SYRP: 5.0000 mL | ORAL_SOLUTION | ORAL | Status: DC | PRN

## 2013-08-09 NOTE — Patient Instructions (Signed)
Your chest is clear today and seems to be a new respiratory viral infection. Cough can last 2-3 weeks total.  Cough med for comfort . At this time. No signs of pneumonia .

## 2013-08-09 NOTE — Telephone Encounter (Signed)
Patient Information:  Caller Name: [name]  Phone: [phone]  Patient: Patty Bell, Patty Bell  Gender: Female  DOB: 04-15-1976  Age: 38 Years  PCP: Berniece AndreasPanosh, Wanda (Family Practice)  Pregnant: No  Office Follow Up:  Does the office need to follow up with this patient?: No  Instructions For The Office: N/A   Symptoms  Reason For Call & Symptoms: Treated for sinus infection one month ago and then flu with secondary infection and took another round of antibiotics. She now has another cold- onset 08/05/13 with persistant dry cough- worse at night. Afebrile. She is having coughing spells to the point of gagging.   Reviewed Health History In EMR: Yes  Reviewed Medications In EMR: Yes  Reviewed Allergies In EMR: Yes  Reviewed Surgeries / Procedures: Yes  Date of Onset of Symptoms: 08/05/2013  Treatments Tried: Prescription Expectorant  Treatments Tried Worked: No OB / GYN:  LMP: 07/15/2013  Guideline(s) Used:  Cough  Disposition Per Guideline:   See Today in Office  Reason For Disposition Reached:   Severe coughing spells (e.g., whooping sound after coughing, vomiting after coughing)  Advice Given:  Reassurance  Coughing is the way that our lungs remove irritants and mucus. It helps protect our lungs from getting pneumonia.  You can get a dry hacking cough after a chest cold. Sometimes this type of cough can last 1-3 weeks, and be worse at night.  You can also get a cough after being exposed to irritating substances like smoke, strong perfumes, and dust.  Cough Medicines:  OTC Cough Drops: Cough drops can help a lot, especially for mild coughs. They reduce coughing by soothing your irritated throat and removing that tickle sensation in the back of the throat. Cough drops also have the advantage of portability - you can carry them with you.  Home Remedy - Hard Candy: Hard candy works just as well as medicine-flavored OTC cough drops. Diabetics should use sugar-free candy.  Home Remedy  - Honey: This old home remedy has been shown to help decrease coughing at night. The adult dosage is 2 teaspoons (10 ml) at bedtime. Honey should not be given to infants under one year of age.  Coughing Spasms:  Drink warm fluids. Inhale warm mist (Reason: both relax the airway and loosen up the phlegm).  Suck on cough drops or hard candy to coat the irritated throat.  Prevent Dehydration:  Drink adequate liquids.  This will help soothe an irritated or dry throat and loosen up the phlegm.  Expected Course:   The expected course depends on what is causing the cough.  Viral bronchitis (chest cold) causes a cough that lasts 1 to 3 weeks. Sometimes you may cough up lots of phlegm (sputum, mucus). The mucus can normally be white, gray, yellow, or green.  Call Back If:  Difficulty breathing  Cough lasts more than 3 weeks  Fever lasts > 3 days  You become worse.  Patient Will Follow Care Advice:  YES  Appointment Scheduled:  08/09/2013 09:45:00 Appointment Scheduled Provider:  Berniece AndreasPanosh, Wanda [facility](Family Practice)

## 2013-08-09 NOTE — Progress Notes (Signed)
Chief Complaint  Patient presents with  . Cough    HPI: Patient comes in today for SDA for  new problem evaluation. rx for sinusitis and then  fmaily sick and cough persistes at times .  Fever  None recently. Sinus infection was better went to [place] and child got sick after return to day care   Thursday night . 4 days ago got sick again with difficult cough keeping others awake. 30 month old at home. No sob but coughing most nights trying other remedies  flumacil acetyl cyteine  Omeprazole after antibiotic and Klancid  ROS: See pertinent positives and negatives per HPI.No v or diarrhea now  No new rash  Sob   Past Medical History  Diagnosis Date  . Asthma     as a child  . 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 Plantar fasciitis   . Scoliosis     bracing    Family History  Problem Relation Age of Onset  . Hyperlipidemia      parent and grandparent  . Hypertension    . Heart disease    . Stroke      History   Social History  . Marital Status: Married    Spouse Name: N/A    Number of Children: N/A  . Years of Education: N/A   Social History Main Topics  . Smoking status: Never Smoker   . Smokeless tobacco: None  . Alcohol Use: Yes  . Drug Use: No  . Sexual Activity: None   Other Topics Concern  . 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 ibuprofen (ADVIL,MOTRIN) 600 MG tablet Take 1 tablet (600 mg total) by mouth every 6 (six) hours.  Marland Kitchen HYDROcodone-homatropine (HYCODAN) 5-1.5 MG/5ML syrup Take 5 mLs by mouth every 4 (four) hours as needed for cough.  . [DISCONTINUED] cefUROXime (CEFTIN) 500 MG tablet Take 1 tablet (500 mg total) by mouth 2 (two) times daily.    EXAM:  BP 124/80  Pulse  92  Temp(Src) 98.7 F (37.1 C) (Oral)  Wt 201 lb (91.173 kg)  SpO2 98%  Body mass index is 29.04 kg/(m^2). WDWN in NAD  quiet respirations; mildly congested  somewhat hoarse. Non toxic . Looks a bit tired  HEENT: Normocephalic ;atraumatic , Eyes;  PERRL, EOMs  Full, lids and conjunctiva clear,,Ears: no deformities, canals nl, TM landmarks normal, Nose: no deformity mild stuffy  but congested;face non tender Mouth : OP clear without lesion or edema . Neck: Supple without adenopathy or masses or bruits Chest:  Clear to A&P without wheezes rales or rhonchi CV:  S1-S2 no gallops or murmurs peripheral perfusion is normal Skin :nl perfusion and no acute rashes  heral edema nl cap refill  PSYCH: pleasant and cooperative, no obvious depression or anxiety  ASSESSMENT AND PLAN:  Discussed the following assessment and plan:  Viral upper respiratory tract infection with cough Child  In day care   High exposures   Seems to be a new viral resp infectoin   Comfort care at this point    Expectant management.  Disc  this doesn't seem out of ordinary and no sx of pna today . Try cough med at night for comfort  .  -Patient advised to return or notify health care team  if symptoms worsen or persist or new concerns arise.  Patient Instructions  Your chest is clear today and seems to be a new respiratory viral infection. Cough can last 2-3 weeks total.  Cough med for comfort . At this time. No signs of pneumonia .   Neta MendsWanda K. Panosh M.D.

## 2014-04-25 ENCOUNTER — Encounter: Payer: Self-pay | Admitting: Internal Medicine

## 2014-05-12 ENCOUNTER — Ambulatory Visit (INDEPENDENT_AMBULATORY_CARE_PROVIDER_SITE_OTHER): Payer: 59 | Admitting: Family Medicine

## 2014-05-12 ENCOUNTER — Encounter: Payer: Self-pay | Admitting: Family Medicine

## 2014-05-12 ENCOUNTER — Telehealth: Payer: Self-pay | Admitting: Internal Medicine

## 2014-05-12 VITALS — BP 120/70 | HR 82 | Temp 98.7°F | Wt 198.0 lb

## 2014-05-12 DIAGNOSIS — R059 Cough, unspecified: Secondary | ICD-10-CM

## 2014-05-12 DIAGNOSIS — R05 Cough: Secondary | ICD-10-CM

## 2014-05-12 MED ORDER — BENZONATATE 200 MG PO CAPS: 200.0000 mg | ORAL_CAPSULE | Freq: Three times a day (TID) | ORAL | Status: DC | PRN

## 2014-05-12 NOTE — Patient Instructions (Signed)
Cough, Adult  A cough is a reflex that helps clear your throat and airways. It can help heal the body or may be a reaction to an irritated airway. A cough may only last 2 or 3 weeks (acute) or may last more than 8 weeks (chronic).  CAUSES Acute cough:  Viral or bacterial infections. Chronic cough:  Infections.  Allergies.  Asthma.  Post-nasal drip.  Smoking.  Heartburn or acid reflux.  Some medicines.  Chronic lung problems (COPD).  Cancer. SYMPTOMS   Cough.  Fever.  Chest pain.  Increased breathing rate.  High-pitched whistling sound when breathing (wheezing).  Colored mucus that you cough up (sputum). TREATMENT   A bacterial cough may be treated with antibiotic medicine.  A viral cough must run its course and will not respond to antibiotics.  Your caregiver may recommend other treatments if you have a chronic cough. HOME CARE INSTRUCTIONS   Only take over-the-counter or prescription medicines for pain, discomfort, or fever as directed by your caregiver. Use cough suppressants only as directed by your caregiver.  Use a cold steam vaporizer or humidifier in your bedroom or home to help loosen secretions.  Sleep in a semi-upright position if your cough is worse at night.  Rest as needed.  Stop smoking if you smoke. SEEK IMMEDIATE MEDICAL CARE IF:   You have pus in your sputum.  Your cough starts to worsen.  You cannot control your cough with suppressants and are losing sleep.  You begin coughing up blood.  You have difficulty breathing.  You develop pain which is getting worse or is uncontrolled with medicine.  You have a fever. MAKE SURE YOU:   Understand these instructions.  Will watch your condition.  Will get help right away if you are not doing well or get worse. Document Released: 12/07/2010 Document Revised: 09/02/2011 Document Reviewed: 12/07/2010 [employer] Patient Information 2015 [employer]. This information is not intended  to replace advice given to you by your health care provider. Make sure you discuss any questions you have with your health care provider.  

## 2014-05-12 NOTE — Progress Notes (Signed)
Pre visit review using our clinic review tool, if applicable. No additional management support is needed unless otherwise documented below in the visit note. 

## 2014-05-12 NOTE — Progress Notes (Signed)
   Subjective:    Patient ID: Patty Bell, female    DOB: 27-Jun-1975, 38 y.o.   MRN: [id number]  HPI Acute visit for cough. She states around October 26 she developed sore throat along with some nasal congestion and cough. Never smoked. Went to urgent care down at the [facility] November 7 and was prescribed albuterol and Zithromax. She has never had any fever or chills. No dyspnea. Possibly some mild intermittent wheezing. Cough is worse at night. Took leftover Hycodan cough syrup which helped slightly.  Her husband apparently was diagnosed recently with mono. She does not have any sore throat this time is not any adenopathy or fever or other clinical suggestion of mono.  Past Medical History  Diagnosis Date  . Asthma     as a child  . 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 Plantar fasciitis   . Scoliosis     bracing   Past Surgical History  Procedure Laterality Date  . No past surgeries    . Cesarean section N/A 08/06/2012    Procedure: CESAREAN SECTION;  Surgeon: Turner Danielsavid C Lowe, MD;  Location: [facility];  Service: Obstetrics;  Laterality: N/A;    reports that she has never smoked. She does not have any smokeless tobacco history on file. She reports that she drinks alcohol. She reports that she does not use illicit drugs. family history includes Heart disease in an other family member; Hyperlipidemia in an other family member; Hypertension in an other family member; Stroke in an other family member. Allergies  Allergen Reactions  . Amoxicillin     REACTION: rash  . Penicillins Other (See Comments)    Happened in childhood - reaction unknown      Review of Systems  Constitutional: Negative for fever and chills.  HENT: Negative for sore throat.   Respiratory: Positive for cough. Negative for shortness of breath.   Neurological: Negative for headaches.  Hematological: Negative for adenopathy.       Objective:   Physical Exam  Constitutional: She appears  well-developed and well-nourished.  HENT:  Right Ear: External ear normal.  Left Ear: External ear normal.  Mouth/Throat: Oropharynx is clear and moist.  Neck: Neck supple.  Cardiovascular: Normal rate and regular rhythm.  Exam reveals no friction rub.   Pulmonary/Chest: Effort normal and breath sounds normal. No respiratory distress. She has no wheezes. She has no rales.  Lymphadenopathy:    She has no cervical adenopathy.          Assessment & Plan:  Persistent cough. Suspect recent viral URI.  Tessalon Perles 200 mg every 8 hours as needed for cough. She will use Hycodan at night as needed for severe cough. Follow-up as needed

## 2014-05-12 NOTE — Telephone Encounter (Signed)
Pt would like a cpx before end of yr. Can I create 30 min slot? °

## 2014-05-13 NOTE — Telephone Encounter (Signed)
Ok

## 2014-05-16 NOTE — Telephone Encounter (Signed)
Pt has been sch

## 2014-06-08 ENCOUNTER — Other Ambulatory Visit (INDEPENDENT_AMBULATORY_CARE_PROVIDER_SITE_OTHER): Payer: 59

## 2014-06-08 DIAGNOSIS — Z0000 Encounter for general adult medical examination without abnormal findings: Secondary | ICD-10-CM

## 2014-06-08 LAB — CBC WITH DIFFERENTIAL/PLATELET
BASOS PCT: 0.5 % (ref 0.0–3.0)
Basophils Absolute: 0 10*3/uL (ref 0.0–0.1)
EOS PCT: 3.3 % (ref 0.0–5.0)
Eosinophils Absolute: 0.3 10*3/uL (ref 0.0–0.7)
HEMATOCRIT: 42.7 % (ref 36.0–46.0)
HEMOGLOBIN: 14.1 g/dL (ref 12.0–15.0)
LYMPHS ABS: 2 10*3/uL (ref 0.7–4.0)
Lymphocytes Relative: 24.3 % (ref 12.0–46.0)
MCHC: 33.1 g/dL (ref 30.0–36.0)
MCV: 94.4 fl (ref 78.0–100.0)
MONO ABS: 0.4 10*3/uL (ref 0.1–1.0)
Monocytes Relative: 5 % (ref 3.0–12.0)
NEUTROS ABS: 5.5 10*3/uL (ref 1.4–7.7)
Neutrophils Relative %: 66.9 % (ref 43.0–77.0)
Platelets: 195 10*3/uL (ref 150.0–400.0)
RBC: 4.52 Mil/uL (ref 3.87–5.11)
RDW: 12.3 % (ref 11.5–15.5)
WBC: 8.2 10*3/uL (ref 4.0–10.5)

## 2014-06-08 LAB — LIPID PANEL
CHOLESTEROL: 198 mg/dL (ref 0–200)
HDL: 84.9 mg/dL (ref >39.00)
LDL CALC: 101 mg/dL — AB (ref 0–99)
NonHDL: 113.1
Total CHOL/HDL Ratio: 2
Triglycerides: 59 mg/dL (ref 0.0–149.0)
VLDL: 11.8 mg/dL (ref 0.0–40.0)

## 2014-06-08 LAB — HEPATIC FUNCTION PANEL
ALT: 15 U/L (ref 0–35)
AST: 16 U/L (ref 0–37)
Albumin: 4.3 g/dL (ref 3.5–5.2)
Alkaline Phosphatase: 44 U/L (ref 39–117)
Bilirubin, Direct: 0 mg/dL (ref 0.0–0.3)
Total Bilirubin: 0.7 mg/dL (ref 0.2–1.2)
Total Protein: 7.5 g/dL (ref 6.0–8.3)

## 2014-06-08 LAB — BASIC METABOLIC PANEL
BUN: 16 mg/dL (ref 6–23)
CHLORIDE: 107 meq/L (ref 96–112)
CO2: 25 mEq/L (ref 19–32)
Calcium: 9.3 mg/dL (ref 8.4–10.5)
Creatinine, Ser: 0.9 mg/dL (ref 0.4–1.2)
GFR: 74.42 mL/min (ref >60.00)
Glucose, Bld: 96 mg/dL (ref 70–99)
POTASSIUM: 4.4 meq/L (ref 3.5–5.1)
Sodium: 137 mEq/L (ref 135–145)

## 2014-06-08 LAB — TSH: TSH: 2.81 u[IU]/mL (ref 0.35–4.50)

## 2014-06-21 ENCOUNTER — Other Ambulatory Visit (HOSPITAL_COMMUNITY)
Admission: RE | Admit: 2014-06-21 | Discharge: 2014-06-21 | Disposition: A | Discharge status: Home / Self Care | Payer: 59 | Source: Ambulatory Visit | Attending: Internal Medicine | Admitting: Internal Medicine

## 2014-06-21 ENCOUNTER — Encounter: Payer: Self-pay | Admitting: Internal Medicine

## 2014-06-21 ENCOUNTER — Ambulatory Visit (INDEPENDENT_AMBULATORY_CARE_PROVIDER_SITE_OTHER): Payer: 59 | Admitting: Internal Medicine

## 2014-06-21 VITALS — BP 116/80 | Temp 98.2°F | Ht 69.5 in | Wt 203.4 lb

## 2014-06-21 DIAGNOSIS — Z01419 Encounter for gynecological examination (general) (routine) without abnormal findings: Secondary | ICD-10-CM | POA: Insufficient documentation

## 2014-06-21 DIAGNOSIS — Z0000 Encounter for general adult medical examination without abnormal findings: Secondary | ICD-10-CM | POA: Insufficient documentation

## 2014-06-21 NOTE — Patient Instructions (Signed)
Will notify you when pap results are available. Exam is normal. Healthy lifestyle includes : At least 150 minutes of exercise weeks  , weight at healthy levels, which is usually   BMI 19-25. Avoid trans fats and processed foods;  Increase fresh fruits and veges to 5 servings per day. And avoid sweet beverages including tea and juice. Mediterranean diet with olive oil and nuts have been noted to be heart and brain healthy . Avoid tobacco products . Limit  alcohol to  7 per week for women and 14 servings for men.  Get adequate sleep . Wear seat belts . Don't text and drive .   screening mammo at 8340 - age 38   . Age is biggest  Risk factor  Yearly visit  Or as needed

## 2014-06-21 NOTE — Addendum Note (Signed)
Addended by: Raj JanusADKINS, Cerinity Zynda T on: 06/21/2014 03:00 PM   Modules accepted: Orders

## 2014-06-21 NOTE — Progress Notes (Signed)
Pre visit review using our clinic review tool, if applicable. No additional management support is needed unless otherwise documented below in the visit note.  Chief Complaint  Patient presents with  . Annual Exam    wants pap    HPI: Patient  Patty Bell  38 y.o. comes in today for Preventive Health Care visit No major change in health status since last visit . Frustrated at times  Hard to do healthy things  Works outside home  8-6 .    2 year okd .  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     Husband helps a lot  residual cough from uri no cp sob  Mom dx breast cancer age 44  No contraception periods normal  Health Maintenance  Topic Date Due  . INFLUENZA VACCINE  01/23/2015  . PAP SMEAR  09/15/2015  . TETANUS/TDAP  06/12/2022   Health Maintenance Review LIFESTYLE:  Exercise:  Not at  This time.  Husband helping. With child busy  Tobacco/ETS:no Alcohol: per day  Sugar beverages: Sleep:ok Drug use: no PAP: wants one todaylast one 2 years ago ?  ROS:  GEN/ HEENT: No fever, significant weight changes sweats headaches vision problems hearing changes, CV/ PULM; No chest pain shortness of breath cough, syncope,edema  change in exercise tolerance. GI /GU: No adominal pain, vomiting, change in bowel habits. No blood in the stool. No significant GU symptoms. SKIN/HEME: ,no acute skin rashes suspicious lesions or bleeding. No lymphadenopathy, nodules, masses.  NEURO/ PSYCH:  No neurologic signs such as weakness numbness. No depression anxiety. IMM/ Allergy: No unusual infections.  Allergy .   REST of 12 system review negative except as per HPI   Past Medical History  Diagnosis Date  . Asthma     as a child  . 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 Plantar fasciitis   . Scoliosis     bracing    Past Surgical History  Procedure Laterality Date  . No past surgeries    . Cesarean section N/A 08/06/2012    Procedure: CESAREAN SECTION;  Surgeon: Turner Daniels, MD;   Location: [facility];  Service: Obstetrics;  Laterality: N/A;    Family History  Problem Relation Age of Onset  . Hyperlipidemia      parent and grandparent  . Hypertension    . Heart disease    . Stroke    . Breast cancer Mother     History   Social History  . Marital Status: Married    Spouse Name: N/A    Number of Children: N/A  . Years of Education: N/A   Social History Main Topics  . Smoking status: Never Smoker   . Smokeless tobacco: None  . Alcohol Use: Yes  . Drug Use: No  . Sexual Activity: None   Other Topics Concern  . 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 [DISCONTINUED] benzonatate (TESSALON) 200 MG capsule Take 1 capsule (200 mg total) by mouth 3 (three) times daily as needed for cough.  . [  DISCONTINUED] ibuprofen (ADVIL,MOTRIN) 600 MG tablet Take 1 tablet (600 mg total) by mouth every 6 (six) hours.    EXAM:  BP 116/80 mmHg  Temp(Src) 98.2 F (36.8 C) (Oral)  Ht 5' 9.5" (1.765 m)  Wt 203 lb 6.4 oz (92.262 kg)  BMI 29.62 kg/m2  Body mass index is 29.62 kg/(m^2).  Physical Exam: Vital signs reviewed BJY:NWGNGEN:This is a well-developed well-nourished alert cooperative    who appearsr stated age in no acute distress.  HEENT: normocephalic atraumatic , Eyes: PERRL EOM's full, conjunctiva clear, Nares: paten,t no deformity discharge or tenderness., Ears: no deformity EAC's clear TMs with normal landmarks. Mouth: clear OP, no lesions, edema.  Moist mucous membranes. Dentition in adequate repair. NECK: supple without masses, thyromegaly or bruits. Breast: normal by inspection . No dimpling, discharge, masses, tenderness or discharge . CHEST/PULM:  Clear to auscultation and percussion  breath sounds equal no wheeze , rales or rhonchi. No chest wall deformities or tenderness. CV: PMI is nondisplaced, S1 S2 no gallops, murmurs, rubs. Peripheral pulses are full without delay.No JVD .  ABDOMEN: Bowel sounds normal nontender  No guard or rebound, no hepato splenomegal no CVA tenderness.  No hernia. Extremtities:  No clubbing cyanosis or edema, no acute joint swelling or redness no focal atrophy NEURO:  Oriented x3, cranial nerves 3-12 appear to be intact, no obvious focal weakness,gait within normal limits no abnormal reflexes or asymmetrical SKIN: No acute rashes normal turgor, color, no bruising or petechiae. PSYCH: Oriented, good eye contact, no obvious depression anxiety, cognition and judgment appear normal. Emotional at times disc schedule  Not depressed  LN: no cervical axillary inguinal adenopathy Pelvic: NL ext GU, labia clear without lesions or rash . Vagina no lesions .Cervix: clear  UTERUS: Neg CMT Adnexa:  clear no masses . PAP done  Lab Results  Component Value Date   WBC 8.2 06/08/2014   HGB 14.1 06/08/2014   HCT 42.7 06/08/2014   PLT 195.0 06/08/2014   GLUCOSE 96 06/08/2014   CHOL 198 06/08/2014   TRIG 59.0 06/08/2014   HDL 84.90 06/08/2014   LDLDIRECT 103.4 05/14/2011   LDLCALC 101* 06/08/2014   ALT 15 06/08/2014   AST 16 06/08/2014   NA 137 06/08/2014   K 4.4 06/08/2014   CL 107 06/08/2014   CREATININE 0.9 06/08/2014   BUN 16 06/08/2014   CO2 25 06/08/2014   TSH 2.81 06/08/2014    ASSESSMENT AND PLAN:  Discussed the following assessment and plan:  Visit for preventive health examination  Encounter for routine gynecological examination Counseled regarding healthy nutrition, exercise, sleep, injury prevention, calcium vit d and healthy weight . Strategies with 38 YO at home .Marland Kitchen. Patient Care Team: Madelin HeadingsWanda K Frederica Chrestman, MD as PCP - General (Internal Medicine) Loreta Aveaniel F Murphy, MD (Orthopedic Surgery) Meriel Picaichard M Holland, MD as Consulting Physician  (Obstetrics and Gynecology) Patient Instructions  Will notify you when pap results are available. Exam is normal. Healthy lifestyle includes : At least 150 minutes of exercise weeks  , weight at healthy levels, which is usually   BMI 19-25. Avoid trans fats and processed foods;  Increase fresh fruits and veges to 5 servings per day. And avoid sweet beverages including tea and juice. Mediterranean diet with olive oil and nuts have been noted to be heart and brain healthy . Avoid tobacco products . Limit  alcohol to  7 per week for women and 14 servings for men.  Get adequate sleep . Wear seat belts . Don't text  and drive .   screening mammo at 6340 - age 38   . Age is biggest  Risk factor  Yearly visit  Or as needed     Neta MendsWanda K. Kahne Helfand M.D.

## 2014-06-22 LAB — CYTOLOGY - PAP

## 2014-06-22 NOTE — Progress Notes (Signed)
Quick Note:  Tell patient PAP is normal. ______ 

## 2014-06-23 ENCOUNTER — Encounter: Payer: Self-pay | Admitting: Family Medicine

## 2014-11-30 ENCOUNTER — Ambulatory Visit: Payer: Self-pay | Admitting: Family Medicine

## 2014-11-30 ENCOUNTER — Ambulatory Visit (INDEPENDENT_AMBULATORY_CARE_PROVIDER_SITE_OTHER): Payer: 59 | Admitting: Family Medicine

## 2014-11-30 ENCOUNTER — Encounter: Payer: Self-pay | Admitting: Family Medicine

## 2014-11-30 VITALS — BP 108/64 | HR 89 | Temp 98.2°F | Wt 183.7 lb

## 2014-11-30 DIAGNOSIS — J0100 Acute maxillary sinusitis, unspecified: Secondary | ICD-10-CM

## 2014-11-30 DIAGNOSIS — M26609 Unspecified temporomandibular joint disorder, unspecified side: Secondary | ICD-10-CM

## 2014-11-30 DIAGNOSIS — L42 Pityriasis rosea: Secondary | ICD-10-CM

## 2014-11-30 DIAGNOSIS — M2660 Temporomandibular joint disorder, unspecified: Secondary | ICD-10-CM

## 2014-11-30 DIAGNOSIS — M26659 Arthropathy of unspecified temporomandibular joint: Secondary | ICD-10-CM

## 2014-11-30 MED ORDER — CEFUROXIME AXETIL 500 MG PO TABS: 500.0000 mg | ORAL_TABLET | Freq: Two times a day (BID) | ORAL | Status: DC

## 2014-11-30 NOTE — Progress Notes (Signed)
   Subjective:    Patient ID: Patty Bell, female    DOB: Apr 17, 1976, 39 y.o.   MRN: [id number]  HPI Here for 3 things. First for 2 weeks she has had sinus pressure, PND, and blowing green mucus from the nose. No fever or cough. Second she has had one week of an itchy rash over the trunk. Third for 2 weeks she has had pain on the left side of her face which she assumes is coming from a tooth. It does not hurt to chew but opening her mouth wide is painful.    Review of Systems  Constitutional: Negative.   HENT: Positive for congestion, dental problem, postnasal drip and sinus pressure. Negative for ear pain and sore throat.   Eyes: Negative.   Respiratory: Negative.   Cardiovascular: Negative.   Skin: Positive for rash.       Objective:   Physical Exam  Constitutional: She appears well-developed and well-nourished.  HENT:  Right Ear: External ear normal.  Left Ear: External ear normal.  Nose: Nose normal.  Mouth/Throat: Oropharynx is clear and moist.  She is very tender over the left TMJ. No crepitus and full ROM  Eyes: Conjunctivae are normal.  Neck: No thyromegaly present.  Cardiovascular: Normal rate, regular rhythm, normal heart sounds and intact distal pulses.   Pulmonary/Chest: Effort normal and breath sounds normal.  Lymphadenopathy:    She has no cervical adenopathy.  Skin:  Widespread rash over the trunk consisting of macular spots and raised patches of erythema           Assessment & Plan:  For the sinusitis she will take Ceftin and Mucinex D. She has pityriasis rosea and this is self limited. She can use Benadryl for the itching. Finally she has TMJ pain. I advised her to rest the jaw, avoid taking big bites of food and avoid chewing gum. Use Ibuprofen prn.

## 2014-11-30 NOTE — Progress Notes (Signed)
Pre visit review using our clinic review tool, if applicable. No additional management support is needed unless otherwise documented below in the visit note. 

## 2015-04-06 ENCOUNTER — Telehealth: Payer: Self-pay | Admitting: Internal Medicine

## 2015-04-06 NOTE — Telephone Encounter (Signed)
Pt has last cpx on 06/21/14. Can  I use fri 06/23/15 to create 30 min slot for this patient?

## 2015-04-06 NOTE — Telephone Encounter (Signed)
Left message on machine for pt to call back.

## 2015-04-06 NOTE — Telephone Encounter (Signed)
yes

## 2015-04-06 NOTE — Telephone Encounter (Signed)
Pt scheduled  

## 2015-06-01 ENCOUNTER — Other Ambulatory Visit (INDEPENDENT_AMBULATORY_CARE_PROVIDER_SITE_OTHER): Payer: 59

## 2015-06-01 DIAGNOSIS — Z0000 Encounter for general adult medical examination without abnormal findings: Secondary | ICD-10-CM

## 2015-06-01 LAB — HEPATIC FUNCTION PANEL
ALBUMIN: 4.2 g/dL (ref 3.5–5.2)
ALT: 16 U/L (ref 0–35)
AST: 19 U/L (ref 0–37)
Alkaline Phosphatase: 31 U/L — ABNORMAL LOW (ref 39–117)
Bilirubin, Direct: 0.2 mg/dL (ref 0.0–0.3)
TOTAL PROTEIN: 6.5 g/dL (ref 6.0–8.3)
Total Bilirubin: 0.8 mg/dL (ref 0.2–1.2)

## 2015-06-01 LAB — CBC WITH DIFFERENTIAL/PLATELET
BASOS PCT: 0.6 % (ref 0.0–3.0)
Basophils Absolute: 0 10*3/uL (ref 0.0–0.1)
EOS PCT: 2 % (ref 0.0–5.0)
Eosinophils Absolute: 0.1 10*3/uL (ref 0.0–0.7)
HEMATOCRIT: 40 % (ref 36.0–46.0)
HEMOGLOBIN: 13.6 g/dL (ref 12.0–15.0)
LYMPHS PCT: 35.2 % (ref 12.0–46.0)
Lymphs Abs: 1.6 10*3/uL (ref 0.7–4.0)
MCHC: 34 g/dL (ref 30.0–36.0)
MCV: 94.1 fl (ref 78.0–100.0)
Monocytes Absolute: 0.3 10*3/uL (ref 0.1–1.0)
Monocytes Relative: 5.5 % (ref 3.0–12.0)
NEUTROS ABS: 2.7 10*3/uL (ref 1.4–7.7)
Neutrophils Relative %: 56.7 % (ref 43.0–77.0)
PLATELETS: 193 10*3/uL (ref 150.0–400.0)
RBC: 4.25 Mil/uL (ref 3.87–5.11)
RDW: 11.7 % (ref 11.5–15.5)
WBC: 4.7 10*3/uL (ref 4.0–10.5)

## 2015-06-01 LAB — BASIC METABOLIC PANEL
BUN: 13 mg/dL (ref 6–23)
CHLORIDE: 104 meq/L (ref 96–112)
CO2: 24 meq/L (ref 19–32)
Calcium: 9.3 mg/dL (ref 8.4–10.5)
Creatinine, Ser: 0.86 mg/dL (ref 0.40–1.20)
GFR: 78.03 mL/min (ref >60.00)
Glucose, Bld: 81 mg/dL (ref 70–99)
POTASSIUM: 4.5 meq/L (ref 3.5–5.1)
Sodium: 137 mEq/L (ref 135–145)

## 2015-06-01 LAB — TSH: TSH: 1.82 u[IU]/mL (ref 0.35–4.50)

## 2015-06-01 LAB — LIPID PANEL
CHOL/HDL RATIO: 3
CHOLESTEROL: 157 mg/dL (ref 0–200)
HDL: 58.3 mg/dL (ref >39.00)
LDL Cholesterol: 91 mg/dL (ref 0–99)
NonHDL: 98.22
TRIGLYCERIDES: 37 mg/dL (ref 0.0–149.0)
VLDL: 7.4 mg/dL (ref 0.0–40.0)

## 2015-06-23 ENCOUNTER — Encounter: Payer: Self-pay | Admitting: Internal Medicine

## 2015-06-23 ENCOUNTER — Ambulatory Visit (INDEPENDENT_AMBULATORY_CARE_PROVIDER_SITE_OTHER): Payer: 59 | Admitting: Internal Medicine

## 2015-06-23 VITALS — BP 122/72 | Temp 98.4°F | Ht 69.0 in | Wt 192.0 lb

## 2015-06-23 DIAGNOSIS — Z0000 Encounter for general adult medical examination without abnormal findings: Secondary | ICD-10-CM | POA: Diagnosis not present

## 2015-06-23 NOTE — Progress Notes (Signed)
Pre visit review using our clinic review tool, if applicable. No additional management support is needed unless otherwise documented below in the visit note.  Chief Complaint  Patient presents with  . Annual Exam    HPI: Patient  Patty Bell  39 y.o. comes in today for Preventive Health Care visit  Doing well 39 year old at home  Health Maintenance  Topic Date Due  . INFLUENZA VACCINE  01/23/2016  . PAP SMEAR  06/21/2017  . TETANUS/TDAP  06/12/2022  . HIV Screening  Completed   Health Maintenance Review LIFESTYLE:  Exercise:   Runs  Tobacco/ETS:n Alcohol: social   Sugar beverages:n Sleep:   7-8  Drug use: no    ROS:  GEN/ HEENT: No fever, significant weight changes sweats headaches vision problems hearing changes, CV/ PULM; No chest pain shortness of breath cough, syncope,edema  change in exercise tolerance. GI /GU: No adominal pain, vomiting, change in bowel habits. No blood in the stool. No significant GU symptoms. SKIN/HEME: ,no acute skin rashes suspicious lesions or bleeding. No lymphadenopathy, nodules, masses.  NEURO/ PSYCH:  No neurologic signs such as weakness numbness. No depression anxiety. IMM/ Allergy: No unusual infections.  Allergy .   REST of 12 system review negative except as per HPI   Past Medical History  Diagnosis Date  . Asthma     as a child  . 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 Plantar fasciitis   . Scoliosis     bracing    Past Surgical History  Procedure Laterality Date  . No past surgeries    . Cesarean section N/A 08/06/2012    Procedure: CESAREAN SECTION;  Surgeon: Luz Lex, MD;  Location: [facility];  Service: Obstetrics;  Laterality: N/A;    Family History  Problem Relation Age of Onset  . Hyperlipidemia      parent and grandparent  . Hypertension    . Heart disease    . Stroke    . Breast cancer Mother     Social History   Social History  . Marital Status: Married    Spouse Name: N/A  . Number  of Children: N/A  . Years of Education: N/A   Social History Main Topics  . Smoking status: Never Smoker   . Smokeless tobacco: None  . Alcohol Use: Yes  . Drug Use: No  . Sexual Activity: Not Asked   Other Topics Concern  . None   Social History Narrative   Married   HH of 3   39 yo old in daycare   Regular exercise- yes   Moved from [place] to [place]    Occupation: a Civil engineer, contracting  grad business degree      Works for [employer]    No pets   Child in day care     Outpatient Prescriptions Prior to Visit  Medication Sig Dispense Refill  . cefUROXime (CEFTIN) 500 MG tablet Take 1 tablet (500 mg total) by mouth 2 (two) times daily with a meal. 20 tablet 0   No facility-administered medications prior to visit.     EXAM:  BP 122/72 mmHg  Temp(Src) 98.4 F (36.9 C) (Oral)  Ht 5' 9" (1.753 m)  Wt 192 lb (87.091 kg)  BMI 28.34 kg/m2  Body mass index is 28.34 kg/(m^2).  Physical Exam: Vital signs reviewed WUJ:WJXB is a well-developed well-nourished alert cooperative    who appearsr stated age in no acute  distress.  HEENT: normocephalic atraumatic , Eyes: PERRL EOM's full, conjunctiva clear, Nares: paten,t no deformity discharge or tenderness., Ears: no deformity EAC's clear TMs with normal landmarks. Mouth: clear OP, no lesions, edema.  Moist mucous membranes. Dentition in adequate repair. NECK: supple without masses, thyromegaly or bruits. CHEST/PULM:  Clear to auscultation and percussion breath sounds equal no wheeze , rales or rhonchi. No chest wall deformities or tenderness.Breast: normal by inspection . No dimpling, discharge, masses, tenderness or discharge . CV: PMI is nondisplaced, S1 S2 no gallops, murmurs, rubs. Peripheral pulses are full without delay.No JVD .  ABDOMEN: Bowel sounds normal nontender  No guard or rebound, no hepato splenomegal no CVA tenderness.  No hernia. Extremtities:  No clubbing cyanosis or edema, no acute joint swelling or redness no focal  atrophy NEURO:  Oriented x3, cranial nerves 3-12 appear to be intact, no obvious focal weakness,gait within normal limits no abnormal reflexes or asymmetrical SKIN: No acute rashes normal turgor, color, no bruising or petechiae. PSYCH: Oriented, good eye contact, no obvious depression anxiety, cognition and judgment appear normal. LN: no cervical axillary inguinal adenopathy  Lab Results  Component Value Date   WBC 4.7 06/01/2015   HGB 13.6 06/01/2015   HCT 40.0 06/01/2015   PLT 193.0 06/01/2015   GLUCOSE 81 06/01/2015   CHOL 157 06/01/2015   TRIG 37.0 06/01/2015   HDL 58.30 06/01/2015   LDLDIRECT 103.4 05/14/2011   LDLCALC 91 06/01/2015   ALT 16 06/01/2015   AST 19 06/01/2015   NA 137 06/01/2015   K 4.5 06/01/2015   CL 104 06/01/2015   CREATININE 0.86 06/01/2015   BUN 13 06/01/2015   CO2 24 06/01/2015   TSH 1.82 06/01/2015    ASSESSMENT AND PLAN:  Discussed the following assessment and plan:  Visit for preventive health examination  Patient Care Team: Burnis Medin, MD as PCP - General (Internal Medicine) Ninetta Lights, MD (Orthopedic Surgery) Molli Posey, MD as Consulting Physician (Obstetrics and Gynecology) Patient Instructions  Continue lifestyle intervention healthy eating and exercise .   Health Maintenance, Female Adopting a healthy lifestyle and getting preventive care can go a long way to promote health and wellness. Talk with your health care provider about what schedule of regular examinations is right for you. This is a good chance for you to check in with your provider about disease prevention and staying healthy. In between checkups, there are plenty of things you can do on your own. Experts have done a lot of research about which lifestyle changes and preventive measures are most likely to keep you healthy. Ask your health care provider for more information. WEIGHT AND DIET  Eat a healthy diet  Be sure to include plenty of vegetables, fruits,  low-fat dairy products, and lean protein.  Do not eat a lot of foods high in solid fats, added sugars, or salt.  Get regular exercise. This is one of the most important things you can do for your health.  Most adults should exercise for at least 150 minutes each week. The exercise should increase your heart rate and make you sweat (moderate-intensity exercise).  Most adults should also do strengthening exercises at least twice a week. This is in addition to the moderate-intensity exercise.  Maintain a healthy weight  Body mass index (BMI) is a measurement that can be used to identify possible weight problems. It estimates body fat based on height and weight. Your health care provider can help determine your BMI and help  you achieve or maintain a healthy weight.  For females 69 years of age and older:   A BMI below 18.5 is considered underweight.  A BMI of 18.5 to 24.9 is normal.  A BMI of 25 to 29.9 is considered overweight.  A BMI of 30 and above is considered obese.  Watch levels of cholesterol and blood lipids  You should start having your blood tested for lipids and cholesterol at 39 years of age, then have this test every 5 years.  You may need to have your cholesterol levels checked more often if:  Your lipid or cholesterol levels are high.  You are older than 39 years of age.  You are at high risk for heart disease.  CANCER SCREENING   Lung Cancer  Lung cancer screening is recommended for adults 45-56 years old who are at high risk for lung cancer because of a history of smoking.  A yearly low-dose CT scan of the lungs is recommended for people who:  Currently smoke.  Have quit within the past 15 years.  Have at least a 30-pack-year history of smoking. A pack year is smoking an average of one pack of cigarettes a day for 1 year.  Yearly screening should continue until it has been 15 years since you quit.  Yearly screening should stop if you develop a  health problem that would prevent you from having lung cancer treatment.  Breast Cancer  Practice breast self-awareness. This means understanding how your breasts normally appear and feel.  It also means doing regular breast self-exams. Let your health care provider know about any changes, no matter how small.  If you are in your 20s or 30s, you should have a clinical breast exam (CBE) by a health care provider every 1-3 years as part of a regular health exam.  If you are 60 or older, have a CBE every year. Also consider having a breast X-ray (mammogram) every year.  If you have a family history of breast cancer, talk to your health care provider about genetic screening.  If you are at high risk for breast cancer, talk to your health care provider about having an MRI and a mammogram every year.  Breast cancer gene (BRCA) assessment is recommended for women who have family members with BRCA-related cancers. BRCA-related cancers include:  Breast.  Ovarian.  Tubal.  Peritoneal cancers.  Results of the assessment will determine the need for genetic counseling and BRCA1 and BRCA2 testing. Cervical Cancer Your health care provider may recommend that you be screened regularly for cancer of the pelvic organs (ovaries, uterus, and vagina). This screening involves a pelvic examination, including checking for microscopic changes to the surface of your cervix (Pap test). You may be encouraged to have this screening done every 3 years, beginning at age 22.  For women ages 110-65, health care providers may recommend pelvic exams and Pap testing every 3 years, or they may recommend the Pap and pelvic exam, combined with testing for human papilloma virus (HPV), every 5 years. Some types of HPV increase your risk of cervical cancer. Testing for HPV may also be done on women of any age with unclear Pap test results.  Other health care providers may not recommend any screening for nonpregnant women who  are considered low risk for pelvic cancer and who do not have symptoms. Ask your health care provider if a screening pelvic exam is right for you.  If you have had past treatment for cervical cancer or  a condition that could lead to cancer, you need Pap tests and screening for cancer for at least 20 years after your treatment. If Pap tests have been discontinued, your risk factors (such as having a new sexual partner) need to be reassessed to determine if screening should resume. Some women have medical problems that increase the chance of getting cervical cancer. In these cases, your health care provider may recommend more frequent screening and Pap tests. Colorectal Cancer  This type of cancer can be detected and often prevented.  Routine colorectal cancer screening usually begins at 39 years of age and continues through 39 years of age.  Your health care provider may recommend screening at an earlier age if you have risk factors for colon cancer.  Your health care provider may also recommend using home test kits to check for hidden blood in the stool.  A small camera at the end of a tube can be used to examine your colon directly (sigmoidoscopy or colonoscopy). This is done to check for the earliest forms of colorectal cancer.  Routine screening usually begins at age 2.  Direct examination of the colon should be repeated every 5-10 years through 39 years of age. However, you may need to be screened more often if early forms of precancerous polyps or small growths are found. Skin Cancer  Check your skin from head to toe regularly.  Tell your health care provider about any new moles or changes in moles, especially if there is a change in a mole's shape or color.  Also tell your health care provider if you have a mole that is larger than the size of a pencil eraser.  Always use sunscreen. Apply sunscreen liberally and repeatedly throughout the day.  Protect yourself by wearing long  sleeves, pants, a wide-brimmed hat, and sunglasses whenever you are outside. HEART DISEASE, DIABETES, AND HIGH BLOOD PRESSURE   High blood pressure causes heart disease and increases the risk of stroke. High blood pressure is more likely to develop in:  People who have blood pressure in the high end of the normal range (130-139/85-89 mm Hg).  People who are overweight or obese.  People who are African American.  If you are 72-103 years of age, have your blood pressure checked every 3-5 years. If you are 72 years of age or older, have your blood pressure checked every year. You should have your blood pressure measured twice--once when you are at a hospital or clinic, and once when you are not at a hospital or clinic. Record the average of the two measurements. To check your blood pressure when you are not at a hospital or clinic, you can use:  An automated blood pressure machine at a pharmacy.  A home blood pressure monitor.  If you are between 43 years and 82 years old, ask your health care provider if you should take aspirin to prevent strokes.  Have regular diabetes screenings. This involves taking a blood sample to check your fasting blood sugar level.  If you are at a normal weight and have a low risk for diabetes, have this test once every three years after 39 years of age.  If you are overweight and have a high risk for diabetes, consider being tested at a younger age or more often. PREVENTING INFECTION  Hepatitis B  If you have a higher risk for hepatitis B, you should be screened for this virus. You are considered at high risk for hepatitis B if:  You were  born in a country where hepatitis B is common. Ask your health care provider which countries are considered high risk.  Your parents were born in a high-risk country, and you have not been immunized against hepatitis B (hepatitis B vaccine).  You have HIV or AIDS.  You use needles to inject street drugs.  You live with  someone who has hepatitis B.  You have had sex with someone who has hepatitis B.  You get hemodialysis treatment.  You take certain medicines for conditions, including cancer, organ transplantation, and autoimmune conditions. Hepatitis C  Blood testing is recommended for:  Everyone born from [birth year] through 1965.  Anyone with known risk factors for hepatitis C. Sexually transmitted infections (STIs)  You should be screened for sexually transmitted infections (STIs) including gonorrhea and chlamydia if:  You are sexually active and are younger than 39 years of age.  You are older than 39 years of age and your health care provider tells you that you are at risk for this type of infection.  Your sexual activity has changed since you were last screened and you are at an increased risk for chlamydia or gonorrhea. Ask your health care provider if you are at risk.  If you do not have HIV, but are at risk, it may be recommended that you take a prescription medicine daily to prevent HIV infection. This is called pre-exposure prophylaxis (PrEP). You are considered at risk if:  You are sexually active and do not regularly use condoms or know the HIV status of your partner(s).  You take drugs by injection.  You are sexually active with a partner who has HIV. Talk with your health care provider about whether you are at high risk of being infected with HIV. If you choose to begin PrEP, you should first be tested for HIV. You should then be tested every 3 months for as long as you are taking PrEP.  PREGNANCY   If you are premenopausal and you may become pregnant, ask your health care provider about preconception counseling.  If you may become pregnant, take 400 to 800 micrograms (mcg) of folic acid every day.  If you want to prevent pregnancy, talk to your health care provider about birth control (contraception). OSTEOPOROSIS AND MENOPAUSE   Osteoporosis is a disease in which the bones lose  minerals and strength with aging. This can result in serious bone fractures. Your risk for osteoporosis can be identified using a bone density scan.  If you are 12 years of age or older, or if you are at risk for osteoporosis and fractures, ask your health care provider if you should be screened.  Ask your health care provider whether you should take a calcium or vitamin D supplement to lower your risk for osteoporosis.  Menopause may have certain physical symptoms and risks.  Hormone replacement therapy may reduce some of these symptoms and risks. Talk to your health care provider about whether hormone replacement therapy is right for you.  HOME CARE INSTRUCTIONS   Schedule regular health, dental, and eye exams.  Stay current with your immunizations.   Do not use any tobacco products including cigarettes, chewing tobacco, or electronic cigarettes.  If you are pregnant, do not drink alcohol.  If you are breastfeeding, limit how much and how often you drink alcohol.  Limit alcohol intake to no more than 1 drink per day for nonpregnant women. One drink equals 12 ounces of beer, 5 ounces of wine, or 1 ounces of hard  liquor.  Do not use street drugs.  Do not share needles.  Ask your health care provider for help if you need support or information about quitting drugs.  Tell your health care provider if you often feel depressed.  Tell your health care provider if you have ever been abused or do not feel safe at home.   This information is not intended to replace advice given to you by your health care provider. Make sure you discuss any questions you have with your health care provider.   Document Released: 12/24/2010 Document Revised: 07/01/2014 Document Reviewed: 05/12/2013 Elsevier Interactive Patient Education 2016 [place] K. Panosh M.D.

## 2015-06-23 NOTE — Patient Instructions (Addendum)
Continue lifestyle intervention healthy eating and exercise .   Health Maintenance, Female Adopting a healthy lifestyle and getting preventive care can go a long way to promote health and wellness. Talk with your health care provider about what schedule of regular examinations is right for you. This is a good chance for you to check in with your provider about disease prevention and staying healthy. In between checkups, there are plenty of things you can do on your own. Experts have done a lot of research about which lifestyle changes and preventive measures are most likely to keep you healthy. Ask your health care provider for more information. WEIGHT AND DIET  Eat a healthy diet  Be sure to include plenty of vegetables, fruits, low-fat dairy products, and lean protein.  Do not eat a lot of foods high in solid fats, added sugars, or salt.  Get regular exercise. This is one of the most important things you can do for your health.  Most adults should exercise for at least 150 minutes each week. The exercise should increase your heart rate and make you sweat (moderate-intensity exercise).  Most adults should also do strengthening exercises at least twice a week. This is in addition to the moderate-intensity exercise.  Maintain a healthy weight  Body mass index (BMI) is a measurement that can be used to identify possible weight problems. It estimates body fat based on height and weight. Your health care provider can help determine your BMI and help you achieve or maintain a healthy weight.  For females 29 years of age and older:   A BMI below 18.5 is considered underweight.  A BMI of 18.5 to 24.9 is normal.  A BMI of 25 to 29.9 is considered overweight.  A BMI of 30 and above is considered obese.  Watch levels of cholesterol and blood lipids  You should start having your blood tested for lipids and cholesterol at 39 years of age, then have this test every 5 years.  You may need to  have your cholesterol levels checked more often if:  Your lipid or cholesterol levels are high.  You are older than 39 years of age.  You are at high risk for heart disease.  CANCER SCREENING   Lung Cancer  Lung cancer screening is recommended for adults 65-61 years old who are at high risk for lung cancer because of a history of smoking.  A yearly low-dose CT scan of the lungs is recommended for people who:  Currently smoke.  Have quit within the past 15 years.  Have at least a 30-pack-year history of smoking. A pack year is smoking an average of one pack of cigarettes a day for 1 year.  Yearly screening should continue until it has been 15 years since you quit.  Yearly screening should stop if you develop a health problem that would prevent you from having lung cancer treatment.  Breast Cancer  Practice breast self-awareness. This means understanding how your breasts normally appear and feel.  It also means doing regular breast self-exams. Let your health care provider know about any changes, no matter how small.  If you are in your 20s or 30s, you should have a clinical breast exam (CBE) by a health care provider every 1-3 years as part of a regular health exam.  If you are 69 or older, have a CBE every year. Also consider having a breast X-ray (mammogram) every year.  If you have a family history of breast cancer, talk to  your health care provider about genetic screening.  If you are at high risk for breast cancer, talk to your health care provider about having an MRI and a mammogram every year.  Breast cancer gene (BRCA) assessment is recommended for women who have family members with BRCA-related cancers. BRCA-related cancers include:  Breast.  Ovarian.  Tubal.  Peritoneal cancers.  Results of the assessment will determine the need for genetic counseling and BRCA1 and BRCA2 testing. Cervical Cancer Your health care provider may recommend that you be screened  regularly for cancer of the pelvic organs (ovaries, uterus, and vagina). This screening involves a pelvic examination, including checking for microscopic changes to the surface of your cervix (Pap test). You may be encouraged to have this screening done every 3 years, beginning at age 61.  For women ages 51-65, health care providers may recommend pelvic exams and Pap testing every 3 years, or they may recommend the Pap and pelvic exam, combined with testing for human papilloma virus (HPV), every 5 years. Some types of HPV increase your risk of cervical cancer. Testing for HPV may also be done on women of any age with unclear Pap test results.  Other health care providers may not recommend any screening for nonpregnant women who are considered low risk for pelvic cancer and who do not have symptoms. Ask your health care provider if a screening pelvic exam is right for you.  If you have had past treatment for cervical cancer or a condition that could lead to cancer, you need Pap tests and screening for cancer for at least 20 years after your treatment. If Pap tests have been discontinued, your risk factors (such as having a new sexual partner) need to be reassessed to determine if screening should resume. Some women have medical problems that increase the chance of getting cervical cancer. In these cases, your health care provider may recommend more frequent screening and Pap tests. Colorectal Cancer  This type of cancer can be detected and often prevented.  Routine colorectal cancer screening usually begins at 39 years of age and continues through 39 years of age.  Your health care provider may recommend screening at an earlier age if you have risk factors for colon cancer.  Your health care provider may also recommend using home test kits to check for hidden blood in the stool.  A small camera at the end of a tube can be used to examine your colon directly (sigmoidoscopy or colonoscopy). This is  done to check for the earliest forms of colorectal cancer.  Routine screening usually begins at age 66.  Direct examination of the colon should be repeated every 5-10 years through 39 years of age. However, you may need to be screened more often if early forms of precancerous polyps or small growths are found. Skin Cancer  Check your skin from head to toe regularly.  Tell your health care provider about any new moles or changes in moles, especially if there is a change in a mole's shape or color.  Also tell your health care provider if you have a mole that is larger than the size of a pencil eraser.  Always use sunscreen. Apply sunscreen liberally and repeatedly throughout the day.  Protect yourself by wearing long sleeves, pants, a wide-brimmed hat, and sunglasses whenever you are outside. HEART DISEASE, DIABETES, AND HIGH BLOOD PRESSURE   High blood pressure causes heart disease and increases the risk of stroke. High blood pressure is more likely to develop in:  People who have blood pressure in the high end of the normal range (130-139/85-89 mm Hg).  People who are overweight or obese.  People who are African American.  If you are 18-39 years of age, have your blood pressure checked every 3-5 years. If you are 40 years of age or older, have your blood pressure checked every year. You should have your blood pressure measured twice--once when you are at a hospital or clinic, and once when you are not at a hospital or clinic. Record the average of the two measurements. To check your blood pressure when you are not at a hospital or clinic, you can use:  An automated blood pressure machine at a pharmacy.  A home blood pressure monitor.  If you are between 55 years and 79 years old, ask your health care provider if you should take aspirin to prevent strokes.  Have regular diabetes screenings. This involves taking a blood sample to check your fasting blood sugar level.  If you are at  a normal weight and have a low risk for diabetes, have this test once every three years after 39 years of age.  If you are overweight and have a high risk for diabetes, consider being tested at a younger age or more often. PREVENTING INFECTION  Hepatitis B  If you have a higher risk for hepatitis B, you should be screened for this virus. You are considered at high risk for hepatitis B if:  You were born in a country where hepatitis B is common. Ask your health care provider which countries are considered high risk.  Your parents were born in a high-risk country, and you have not been immunized against hepatitis B (hepatitis B vaccine).  You have HIV or AIDS.  You use needles to inject street drugs.  You live with someone who has hepatitis B.  You have had sex with someone who has hepatitis B.  You get hemodialysis treatment.  You take certain medicines for conditions, including cancer, organ transplantation, and autoimmune conditions. Hepatitis C  Blood testing is recommended for:  Everyone born from 1945 through 1965.  Anyone with known risk factors for hepatitis C. Sexually transmitted infections (STIs)  You should be screened for sexually transmitted infections (STIs) including gonorrhea and chlamydia if:  You are sexually active and are younger than 39 years of age.  You are older than 39 years of age and your health care provider tells you that you are at risk for this type of infection.  Your sexual activity has changed since you were last screened and you are at an increased risk for chlamydia or gonorrhea. Ask your health care provider if you are at risk.  If you do not have HIV, but are at risk, it may be recommended that you take a prescription medicine daily to prevent HIV infection. This is called pre-exposure prophylaxis (PrEP). You are considered at risk if:  You are sexually active and do not regularly use condoms or know the HIV status of your  partner(s).  You take drugs by injection.  You are sexually active with a partner who has HIV. Talk with your health care provider about whether you are at high risk of being infected with HIV. If you choose to begin PrEP, you should first be tested for HIV. You should then be tested every 3 months for as long as you are taking PrEP.  PREGNANCY   If you are premenopausal and you may become pregnant, ask your health   care provider about preconception counseling.  If you may become pregnant, take 400 to 800 micrograms (mcg) of folic acid every day.  If you want to prevent pregnancy, talk to your health care provider about birth control (contraception). OSTEOPOROSIS AND MENOPAUSE   Osteoporosis is a disease in which the bones lose minerals and strength with aging. This can result in serious bone fractures. Your risk for osteoporosis can be identified using a bone density scan.  If you are 24 years of age or older, or if you are at risk for osteoporosis and fractures, ask your health care provider if you should be screened.  Ask your health care provider whether you should take a calcium or vitamin D supplement to lower your risk for osteoporosis.  Menopause may have certain physical symptoms and risks.  Hormone replacement therapy may reduce some of these symptoms and risks. Talk to your health care provider about whether hormone replacement therapy is right for you.  HOME CARE INSTRUCTIONS   Schedule regular health, dental, and eye exams.  Stay current with your immunizations.   Do not use any tobacco products including cigarettes, chewing tobacco, or electronic cigarettes.  If you are pregnant, do not drink alcohol.  If you are breastfeeding, limit how much and how often you drink alcohol.  Limit alcohol intake to no more than 1 drink per day for nonpregnant women. One drink equals 12 ounces of beer, 5 ounces of wine, or 1 ounces of hard liquor.  Do not use street drugs.  Do  not share needles.  Ask your health care provider for help if you need support or information about quitting drugs.  Tell your health care provider if you often feel depressed.  Tell your health care provider if you have ever been abused or do not feel safe at home.   This information is not intended to replace advice given to you by your health care provider. Make sure you discuss any questions you have with your health care provider.   Document Released: 12/24/2010 Document Revised: 07/01/2014 Document Reviewed: 05/12/2013 Elsevier Interactive Patient Education [employer].

## 2016-06-20 ENCOUNTER — Other Ambulatory Visit: Payer: Self-pay | Admitting: Obstetrics and Gynecology

## 2016-06-20 DIAGNOSIS — Z803 Family history of malignant neoplasm of breast: Secondary | ICD-10-CM

## 2016-06-30 ENCOUNTER — Ambulatory Visit
Admission: RE | Admit: 2016-06-30 | Discharge: 2016-06-30 | Disposition: A | Discharge status: Home / Self Care | Payer: 59 | Source: Ambulatory Visit | Attending: Obstetrics and Gynecology | Admitting: Obstetrics and Gynecology

## 2016-06-30 DIAGNOSIS — Z803 Family history of malignant neoplasm of breast: Secondary | ICD-10-CM

## 2016-06-30 MED ORDER — GADOBENATE DIMEGLUMINE 529 MG/ML IV SOLN
18.0000 mL | Freq: Once | INTRAVENOUS | Status: AC | PRN
  Administered 2016-06-30: 18 mL via INTRAVENOUS

## 2017-04-01 NOTE — Progress Notes (Signed)
 Chief Complaint  Patient presents with  . Annual Exam    CPX today, no pap. Pt had Mammogram and was sent for MRI for breast density. Mole on right pelvic bone.     HPI: Patient  Patty Bell  41 y.o. comes in today for Preventive Health Care visit  Went to ob  gyne   And did 3 d  mammo  And had dense tissue  And  Then had mri. Advised  Because of her moms hx of breast cancer in age 60 range  Nl but uncertain  As to advice   Doing well otherwise   Family hx  Cancer mom 4 years ago  Post menopause.     Health Maintenance  Topic Date Due  . INFLUENZA VACCINE  01/22/2017  . PAP SMEAR  06/21/2017  . TETANUS/TDAP  06/12/2022  . HIV Screening  Completed   Health Maintenance Review LIFESTYLE:  Exercise:   Work out as   runn 4 x per week.  Play sports  Tobacco/ETS: no Alcohol:   More this summer    2 daily .  3 on weekend.  Sugar beverages: no Sleep:   8 - 9  Drug use: no   HH of  3  No pets  Work: over 40 - 50   ROS:  GEN/ HEENT: No fever, significant weight changes sweats headaches vision problems hearing changes, CV/ PULM; No chest pain shortness of breath cough, syncope,edema  change in exercise tolerance. GI /GU: No adominal pain, vomiting, change in bowel habits. No blood in the stool. No significant GU symptoms. SKIN/HEME: ,no acute skin rashes suspicious lesions or bleeding. No lymphadenopathy, nodules, masses.  NEURO/ PSYCH:  No neurologic signs such as weakness numbness. No depression anxiety. IMM/ Allergy: No unusual infections.  Allergy .   REST of 12 system review negative except as per HPI   Past Medical History:  Diagnosis Date  . Asthma    as a child  . Plantar fasciitis   . 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 Scoliosis    bracing    Past Surgical History:  Procedure Laterality Date  . CESAREAN SECTION N/A 08/06/2012   Procedure: CESAREAN SECTION;  Surgeon: David C Lowe, MD;  Location: [facility];  Service: Obstetrics;  Laterality: N/A;    . NO PAST SURGERIES      Family History  Problem Relation Age of Onset  . Hyperlipidemia Unknown        parent and grandparent  . Hypertension Unknown   . Heart disease Unknown   . Stroke Unknown   . Breast cancer Mother     Social History   Social History  . Marital status: Married    Spouse name: N/A  . Number of children: N/A  . Years of education: N/A   Social History Main Topics  . Smoking status: Never Smoker  . Smokeless tobacco: Never Used  . Alcohol use Yes  . Drug use: No  . Sexual activity: Not on file   Other Topics Concern  . Not on file   Social History Narrative   Married   HH of 3   41 yo old in daycare   Regular exercise- yes   Moved from [place] to [place]    Occupation: a civil engineer  grad business degree      Works for [employer]    No pets   Child in day care       No outpatient prescriptions prior to visit.   No facility-administered medications prior to visit.      EXAM:  BP 112/80 (BP Location: Right Arm, Patient Position: Sitting, Cuff Size: Normal)   Pulse 70   Temp 98.1 F (36.7 C) (Oral)   Ht 5' 8.5" (1.74 m)   Wt 200 lb (90.7 kg)   BMI 29.97 kg/m   Body mass index is 29.97 kg/m. Wt Readings from Last 3 Encounters:  04/02/17 200 lb (90.7 kg)  06/23/15 192 lb (87.1 kg)  11/30/14 183 lb 11.2 oz (83.3 kg)    Physical Exam: Vital signs reviewed WGN:FAOZ is a well-developed well-nourished alert cooperative    who appearsr stated age in no acute distress.  HEENT: normocephalic atraumatic , Eyes: PERRL EOM's full, conjunctiva clear, Nares: paten,t no deformity discharge or tenderness., Ears: no deformity EAC's clear TMs with normal landmarks. Mouth: clear OP, no lesions, edema.  Moist mucous membranes. Dentition in adequate repair. NECK: supple without masses, thyromegaly or bruits. CHEST/PULM:  Clear to auscultation and percussion breath sounds equal no wheeze , rales or rhonchi. No chest wall deformities or  tenderness. Breast: normal by inspection . No dimpling, discharge, masses, tenderness or discharge . CV: PMI is nondisplaced, S1 S2 no gallops, murmurs, rubs. Peripheral pulses are full without delay.No JVD .  Breast: normal by inspection . No dimpling, discharge, masses, tenderness or discharge . ABDOMEN: Bowel sounds normal nontender  No guard or rebound, no hepato splenomegal no CVA tenderness.  . Extremtities:  No clubbing cyanosis or edema, no acute joint swelling or redness no focal atrophy NEURO:  Oriented x3, cranial nerves 3-12 appear to be intact, no obvious focal weakness,gait within normal limits no abnormal reflexes or asymmetrical SKIN: No acute rashes normal turgor, color, no bruising or petechiae. PSYCH: Oriented, good eye contact, no obvious depression anxiety, cognition and judgment appear normal. LN: no cervical axillary i adenopathy  Lab Results  Component Value Date   WBC 4.9 04/02/2017   HGB 13.4 04/02/2017   HCT 39.2 04/02/2017   PLT 185.0 04/02/2017   GLUCOSE 98 04/02/2017   CHOL 212 (H) 04/02/2017   TRIG 58.0 04/02/2017   HDL 87.50 04/02/2017   LDLDIRECT 103.4 05/14/2011   LDLCALC 112 (H) 04/02/2017   ALT 16 04/02/2017   AST 18 04/02/2017   NA 138 04/02/2017   K 3.9 04/02/2017   CL 103 04/02/2017   CREATININE 0.88 04/02/2017   BUN 14 04/02/2017   CO2 27 04/02/2017   TSH 2.50 04/02/2017    BP Readings from Last 3 Encounters:  04/02/17 112/80  06/23/15 122/72  11/30/14 108/64   Shared Decision Making   ASSESSMENT AND PLAN:  Discussed the following assessment and plan:  Visit for preventive health examination - Plan: CMP, CBC with Differential/Platelet, Lipid panel, TSH  Need for influenza vaccination - Plan: Flu Vaccine QUAD 6+ mos PF IM (Fluarix Quad PF)  History of hyperglycemia - Plan: CMP, CBC with Differential/Platelet, Lipid panel, TSH  Screening for lipid disorders - Plan: Lipid panel  Family history of breast cancer in mother - Plan:  Ambulatory referral to Genetics  Dense breast tissue on mammogram - Plan: Ambulatory referral to Genetics Discussed with her concerns there are no specific recommendations about what to do with her certain situation. I'm surprised that insurance covered her MRI but certainly glad that they did. As to whether she should have an MRI of her breasts every year based on her current history I don't have enough  information. And doesn't meet criteria just by having one family member with breast cancer. However she might benefit from genetic counseling to assess whether there is increased risk genetic markers etc. And to also update the latest recommendations as years go by as far screening goes. The whole process was anxiety producing for her but she is glad it was normal. She also is aware and plans to decrease her alcohol intake because that may be an undefined risk Patient Care Team: Panosh, Wanda K, MD as PCP - General (Internal Medicine) Murphy, Daniel F, MD (Orthopedic Surgery) Holland, Richard, MD as Consulting Physician (Obstetrics and Gynecology) Patient Instructions  Ages the biggest factor for developing breast cancer. Glad your MRI was good. Continue attention to healthy lifestyle. Limiting alcoholic beverages may be helpful. The people who are highest risk for breast cancer are those that are older and have obvious genetic markers.  No one way to  Follow these screening tests.  consider seeing genetic counselor  To discuss    Preventive Care 40-64 Years, Female Preventive care refers to lifestyle choices and visits with your health care provider that can promote health and wellness. What does preventive care include?  A yearly physical exam. This is also called an annual well check.  Dental exams once or twice a year.  Routine eye exams. Ask your health care provider how often you should have your eyes checked.  Personal lifestyle choices, including: ? Daily care of your teeth and  gums. ? Regular physical activity. ? Eating a healthy diet. ? Avoiding tobacco and drug use. ? Limiting alcohol use. ? Practicing safe sex. ? Taking low-dose aspirin daily starting at age 50. ? Taking vitamin and mineral supplements as recommended by your health care provider. What happens during an annual well check? The services and screenings done by your health care provider during your annual well check will depend on your age, overall health, lifestyle risk factors, and family history of disease. Counseling Your health care provider may ask you questions about your:  Alcohol use.  Tobacco use.  Drug use.  Emotional well-being.  Home and relationship well-being.  Sexual activity.  Eating habits.  Work and work environment.  Method of birth control.  Menstrual cycle.  Pregnancy history.  Screening You may have the following tests or measurements:  Height, weight, and BMI.  Blood pressure.  Lipid and cholesterol levels. These may be checked every 5 years, or more frequently if you are over 50 years old.  Skin check.  Lung cancer screening. You may have this screening every year starting at age 55 if you have a 30-pack-year history of smoking and currently smoke or have quit within the past 15 years.  Fecal occult blood test (FOBT) of the stool. You may have this test every year starting at age 50.  Flexible sigmoidoscopy or colonoscopy. You may have a sigmoidoscopy every 5 years or a colonoscopy every 10 years starting at age 50.  Hepatitis C blood test.  Hepatitis B blood test.  Sexually transmitted disease (STD) testing.  Diabetes screening. This is done by checking your blood sugar (glucose) after you have not eaten for a while (fasting). You may have this done every 1-3 years.  Mammogram. This may be done every 1-2 years. Talk to your health care provider about when you should start having regular mammograms. This may depend on whether you have a  family history of breast cancer.  BRCA-related cancer screening. This may be done if you have   a family history of breast, ovarian, tubal, or peritoneal cancers.  Pelvic exam and Pap test. This may be done every 3 years starting at age 21. Starting at age 30, this may be done every 5 years if you have a Pap test in combination with an HPV test.  Bone density scan. This is done to screen for osteoporosis. You may have this scan if you are at high risk for osteoporosis.  Discuss your test results, treatment options, and if necessary, the need for more tests with your health care provider. Vaccines Your health care provider may recommend certain vaccines, such as:  Influenza vaccine. This is recommended every year.  Tetanus, diphtheria, and acellular pertussis (Tdap, Td) vaccine. You may need a Td booster every 10 years.  Varicella vaccine. You may need this if you have not been vaccinated.  Zoster vaccine. You may need this after age 60.  Measles, mumps, and rubella (MMR) vaccine. You may need at least one dose of MMR if you were born in 1957 or later. You may also need a second dose.  Pneumococcal 13-valent conjugate (PCV13) vaccine. You may need this if you have certain conditions and were not previously vaccinated.  Pneumococcal polysaccharide (PPSV23) vaccine. You may need one or two doses if you smoke cigarettes or if you have certain conditions.  Meningococcal vaccine. You may need this if you have certain conditions.  Hepatitis A vaccine. You may need this if you have certain conditions or if you travel or work in places where you may be exposed to hepatitis A.  Hepatitis B vaccine. You may need this if you have certain conditions or if you travel or work in places where you may be exposed to hepatitis B.  Haemophilus influenzae type b (Hib) vaccine. You may need this if you have certain conditions.  Talk to your health care provider about which screenings and vaccines you need  and how often you need them. This information is not intended to replace advice given to you by your health care provider. Make sure you discuss any questions you have with your health care provider. Document Released: 07/07/2015 Document Revised: 02/28/2016 Document Reviewed: 04/11/2015 Elsevier Interactive Patient Education  2017 [employer]    Wanda K. Panosh M.D.  

## 2017-04-02 ENCOUNTER — Encounter: Payer: Self-pay | Admitting: Internal Medicine

## 2017-04-02 ENCOUNTER — Ambulatory Visit (INDEPENDENT_AMBULATORY_CARE_PROVIDER_SITE_OTHER): Payer: 59 | Admitting: Internal Medicine

## 2017-04-02 VITALS — BP 112/80 | HR 70 | Temp 98.1°F | Ht 68.5 in | Wt 200.0 lb

## 2017-04-02 DIAGNOSIS — Z803 Family history of malignant neoplasm of breast: Secondary | ICD-10-CM

## 2017-04-02 DIAGNOSIS — Z8639 Personal history of other endocrine, nutritional and metabolic disease: Secondary | ICD-10-CM

## 2017-04-02 DIAGNOSIS — Z23 Encounter for immunization: Secondary | ICD-10-CM | POA: Diagnosis not present

## 2017-04-02 DIAGNOSIS — Z13220 Encounter for screening for lipoid disorders: Secondary | ICD-10-CM | POA: Diagnosis not present

## 2017-04-02 DIAGNOSIS — R922 Inconclusive mammogram: Secondary | ICD-10-CM | POA: Diagnosis not present

## 2017-04-02 DIAGNOSIS — Z0000 Encounter for general adult medical examination without abnormal findings: Secondary | ICD-10-CM

## 2017-04-02 LAB — CBC WITH DIFFERENTIAL/PLATELET
BASOS ABS: 0 10*3/uL (ref 0.0–0.1)
BASOS PCT: 0.6 % (ref 0.0–3.0)
EOS PCT: 1.8 % (ref 0.0–5.0)
Eosinophils Absolute: 0.1 10*3/uL (ref 0.0–0.7)
HEMATOCRIT: 39.2 % (ref 36.0–46.0)
Hemoglobin: 13.4 g/dL (ref 12.0–15.0)
LYMPHS ABS: 1.6 10*3/uL (ref 0.7–4.0)
LYMPHS PCT: 33.5 % (ref 12.0–46.0)
MCHC: 34.1 g/dL (ref 30.0–36.0)
MCV: 95.8 fl (ref 78.0–100.0)
MONOS PCT: 5.8 % (ref 3.0–12.0)
Monocytes Absolute: 0.3 10*3/uL (ref 0.1–1.0)
NEUTROS ABS: 2.9 10*3/uL (ref 1.4–7.7)
NEUTROS PCT: 58.3 % (ref 43.0–77.0)
PLATELETS: 185 10*3/uL (ref 150.0–400.0)
RBC: 4.09 Mil/uL (ref 3.87–5.11)
RDW: 11.7 % (ref 11.5–15.5)
WBC: 4.9 10*3/uL (ref 4.0–10.5)

## 2017-04-02 LAB — LIPID PANEL
CHOL/HDL RATIO: 2
Cholesterol: 212 mg/dL — ABNORMAL HIGH (ref 0–200)
HDL: 87.5 mg/dL (ref >39.00)
LDL Cholesterol: 112 mg/dL — ABNORMAL HIGH (ref 0–99)
NONHDL: 124.07
Triglycerides: 58 mg/dL (ref 0.0–149.0)
VLDL: 11.6 mg/dL (ref 0.0–40.0)

## 2017-04-02 LAB — COMPREHENSIVE METABOLIC PANEL
ALT: 16 U/L (ref 0–35)
AST: 18 U/L (ref 0–37)
Albumin: 4.7 g/dL (ref 3.5–5.2)
Alkaline Phosphatase: 37 U/L — ABNORMAL LOW (ref 39–117)
BUN: 14 mg/dL (ref 6–23)
CALCIUM: 9.7 mg/dL (ref 8.4–10.5)
CHLORIDE: 103 meq/L (ref 96–112)
CO2: 27 meq/L (ref 19–32)
Creatinine, Ser: 0.88 mg/dL (ref 0.40–1.20)
GFR: 75.28 mL/min (ref >60.00)
GLUCOSE: 98 mg/dL (ref 70–99)
POTASSIUM: 3.9 meq/L (ref 3.5–5.1)
Sodium: 138 mEq/L (ref 135–145)
Total Bilirubin: 1 mg/dL (ref 0.2–1.2)
Total Protein: 6.9 g/dL (ref 6.0–8.3)

## 2017-04-02 LAB — TSH: TSH: 2.5 u[IU]/mL (ref 0.35–4.50)

## 2017-04-02 NOTE — Patient Instructions (Addendum)
Ages the biggest factor for developing breast cancer. Glad your MRI was good. Continue attention to healthy lifestyle. Limiting alcoholic beverages may be helpful. The people who are highest risk for breast cancer are those that are older and have obvious genetic markers.  No one way to  Follow these screening tests.  consider seeing genetic counselor  To discuss    Preventive Care 40-64 Years, Female Preventive care refers to lifestyle choices and visits with your health care provider that can promote health and wellness. What does preventive care include?  A yearly physical exam. This is also called an annual well check.  Dental exams once or twice a year.  Routine eye exams. Ask your health care provider how often you should have your eyes checked.  Personal lifestyle choices, including: ? Daily care of your teeth and gums. ? Regular physical activity. ? Eating a healthy diet. ? Avoiding tobacco and drug use. ? Limiting alcohol use. ? Practicing safe sex. ? Taking low-dose aspirin daily starting at age 50. ? Taking vitamin and mineral supplements as recommended by your health care provider. What happens during an annual well check? The services and screenings done by your health care provider during your annual well check will depend on your age, overall health, lifestyle risk factors, and family history of disease. Counseling Your health care provider may ask you questions about your:  Alcohol use.  Tobacco use.  Drug use.  Emotional well-being.  Home and relationship well-being.  Sexual activity.  Eating habits.  Work and work environment.  Method of birth control.  Menstrual cycle.  Pregnancy history.  Screening You may have the following tests or measurements:  Height, weight, and BMI.  Blood pressure.  Lipid and cholesterol levels. These may be checked every 5 years, or more frequently if you are over 50 years old.  Skin check.  Lung cancer  screening. You may have this screening every year starting at age 55 if you have a 30-pack-year history of smoking and currently smoke or have quit within the past 15 years.  Fecal occult blood test (FOBT) of the stool. You may have this test every year starting at age 50.  Flexible sigmoidoscopy or colonoscopy. You may have a sigmoidoscopy every 5 years or a colonoscopy every 10 years starting at age 50.  Hepatitis C blood test.  Hepatitis B blood test.  Sexually transmitted disease (STD) testing.  Diabetes screening. This is done by checking your blood sugar (glucose) after you have not eaten for a while (fasting). You may have this done every 1-3 years.  Mammogram. This may be done every 1-2 years. Talk to your health care provider about when you should start having regular mammograms. This may depend on whether you have a family history of breast cancer.  BRCA-related cancer screening. This may be done if you have a family history of breast, ovarian, tubal, or peritoneal cancers.  Pelvic exam and Pap test. This may be done every 3 years starting at age 21. Starting at age 30, this may be done every 5 years if you have a Pap test in combination with an HPV test.  Bone density scan. This is done to screen for osteoporosis. You may have this scan if you are at high risk for osteoporosis.  Discuss your test results, treatment options, and if necessary, the need for more tests with your health care provider. Vaccines Your health care provider may recommend certain vaccines, such as:  Influenza vaccine. This is recommended   every year.  Tetanus, diphtheria, and acellular pertussis (Tdap, Td) vaccine. You may need a Td booster every 10 years.  Varicella vaccine. You may need this if you have not been vaccinated.  Zoster vaccine. You may need this after age 102.  Measles, mumps, and rubella (MMR) vaccine. You may need at least one dose of MMR if you were born in 1957 or later. You may  also need a second dose.  Pneumococcal 13-valent conjugate (PCV13) vaccine. You may need this if you have certain conditions and were not previously vaccinated.  Pneumococcal polysaccharide (PPSV23) vaccine. You may need one or two doses if you smoke cigarettes or if you have certain conditions.  Meningococcal vaccine. You may need this if you have certain conditions.  Hepatitis A vaccine. You may need this if you have certain conditions or if you travel or work in places where you may be exposed to hepatitis A.  Hepatitis B vaccine. You may need this if you have certain conditions or if you travel or work in places where you may be exposed to hepatitis B.  Haemophilus influenzae type b (Hib) vaccine. You may need this if you have certain conditions.  Talk to your health care provider about which screenings and vaccines you need and how often you need them. This information is not intended to replace advice given to you by your health care provider. Make sure you discuss any questions you have with your health care provider. Document Released: 07/07/2015 Document Revised: 02/28/2016 Document Reviewed: 04/11/2015 Elsevier Interactive Patient Education  2017 [employer].

## 2017-04-10 ENCOUNTER — Encounter: Payer: Self-pay | Admitting: Internal Medicine

## 2017-04-10 NOTE — Telephone Encounter (Signed)
I put in the referral already please track to make sure  It is moving forwarrd and how patient to be contacted

## 2017-04-10 NOTE — Telephone Encounter (Signed)
Patient is wanting to know if she can get the referral to the Genetic Counselor mentioned last OV.  Anyone specific in mind that you were wanting to refer patient to? Please advise Dr Fabian SharpPanosh, thanks.

## 2017-04-11 NOTE — Telephone Encounter (Signed)
Th referral is to genetics in oncology     Open  Encounter tab. And report to pat

## 2017-04-11 NOTE — Telephone Encounter (Signed)
I do not see a referral for a DentistGenetic Counselor, the only referral in her chart is for Oncology.  I do not mind placing this referral, was there a specific doctor you had in mind or was it just a general referral?

## 2017-05-23 ENCOUNTER — Encounter: Payer: Self-pay | Admitting: Genetics

## 2017-05-27 ENCOUNTER — Other Ambulatory Visit: Payer: 59

## 2017-05-27 ENCOUNTER — Ambulatory Visit (HOSPITAL_BASED_OUTPATIENT_CLINIC_OR_DEPARTMENT_OTHER): Payer: 59 | Admitting: Genetics

## 2017-05-27 DIAGNOSIS — Z8042 Family history of malignant neoplasm of prostate: Secondary | ICD-10-CM | POA: Diagnosis not present

## 2017-05-27 DIAGNOSIS — Z803 Family history of malignant neoplasm of breast: Secondary | ICD-10-CM | POA: Diagnosis not present

## 2017-05-27 DIAGNOSIS — Z809 Family history of malignant neoplasm, unspecified: Secondary | ICD-10-CM | POA: Diagnosis not present

## 2017-05-27 DIAGNOSIS — Z808 Family history of malignant neoplasm of other organs or systems: Secondary | ICD-10-CM

## 2017-05-27 DIAGNOSIS — Z315 Encounter for genetic counseling: Secondary | ICD-10-CM

## 2017-05-28 ENCOUNTER — Encounter: Payer: Self-pay | Admitting: Genetics

## 2017-05-28 DIAGNOSIS — Z803 Family history of malignant neoplasm of breast: Secondary | ICD-10-CM | POA: Insufficient documentation

## 2017-05-28 DIAGNOSIS — Z8042 Family history of malignant neoplasm of prostate: Secondary | ICD-10-CM | POA: Insufficient documentation

## 2017-05-28 NOTE — Progress Notes (Signed)
REFERRING PROVIDER: Burnis Medin, MD [place], [place] [address]  PRIMARY PROVIDER:  Burnis Medin, MD  PRIMARY REASON FOR VISIT:  1. Family history of breast cancer   2. Family history of prostate cancer     HISTORY OF PRESENT ILLNESS:   Patty Bell, a 41 y.o. female, was seen for a [place] cancer genetics consultation at the request of Dr. Regis Bill due to a family history of cancer.  Patty Bell presents to clinic today to discuss the possibility of a hereditary predisposition to cancer, genetic testing, and to further clarify her future cancer risks, as well as potential cancer risks for family members.   Patty Bell is a 41 y.o. female with no personal history of cancer.  Her breast density is category C, and her Tyrer Cusik risk score is >20%.  She presents to discuss genetics and how this may impact her breast screening plan.   HORMONAL RISK FACTORS:  Menarche was at age 79.  First live birth at age 69.  Ovaries intact: yes.  Hysterectomy: no.  Menopausal status: premenopausal.  HRT use: 0 years.  Colonoscopy: no; not examined. Mammogram within the last year: yes. Number of breast biopsies: 0.  Past Medical History:  Diagnosis Date  . Asthma    as a child  . Family history of breast cancer   . Family history of prostate cancer   . Plantar fasciitis   . 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 Scoliosis    bracing    Past Surgical History:  Procedure Laterality Date  . CESAREAN SECTION N/A 08/06/2012   Procedure: CESAREAN SECTION;  Surgeon: Luz Lex, MD;  Location: [facility];  Service: Obstetrics;  Laterality: N/A;  . NO PAST SURGERIES      Social History   Socioeconomic History  . Marital status: Married    Spouse name: None  . Number of children: None  . Years of education: None  . Highest education level: None  Social Needs  . Financial resource strain: None  . Food insecurity - worry: None  . Food insecurity -  inability: None  . Transportation needs - medical: None  . Transportation needs - non-medical: None  Occupational History  . None  Tobacco Use  . Smoking status: Never Smoker  . Smokeless tobacco: Never Used  Substance and Sexual Activity  . Alcohol use: Yes  . Drug use: No  . Sexual activity: None  Other Topics Concern  . None  Social History Narrative   Married   HH of 3   41 yo old in daycare   Regular exercise- yes   Moved from [place] to [place]    Occupation: a Civil engineer, contracting  grad business degree      Works for [employer]    No pets   Child in day care      FAMILY HISTORY:  We obtained a detailed, 4-generation family history.  Significant diagnoses are listed below: Family History  Problem Relation Age of Onset  . Hyperlipidemia Unknown        parent and grandparent  . Hypertension Unknown   . Heart disease Unknown   . Stroke Unknown   . Breast cancer Mother 61  . Other Maternal Aunt        cryoglobulinemia  . Liver cancer Maternal Uncle        might have hepatitis  . Skin cancer Paternal Uncle   .  Other Paternal Grandmother        breast kyno renived, unsure if it was cancer  . Prostate cancer Paternal Uncle 53       had prostate removed  . Prostate cancer Other        age dx unk, died from this cancer  . Prostate cancer Other        age dx unk, died from th is cancer   Patty Bell has a 56 year-old son with no history of cancer.  Patty Bell has no siblings.   Patty Bell father is in his 62's with no history of cancer.  She reports he is taking pills to lower his chance to get prostate cancer.  Patty Bell has 3 paternal uncles listed below: -1 paternal uncle is 18 and has had a few skin cancers removed from his face (specific type unk).  This uncle has 2 daughters and 2 sons in their 65's-40's.  One son (patient's cousin) was diagnosed with thyroid cancer at 65.   -1 paternal uncle was diagnosed with prostate cancer at 68 and is now 27.  He had his  prostate removed.  He has a son and a daughter (patient's cousins) in their 31's with no history of cancer.  -1 paternal uncle is 72 with no history of cancer.   He has 2 sons and a daughter who are in their 83's and 11's with no history of cancer.  Patty Bell paternal grandfather died at 71 with no history of cancer.  He had 2 brothers who died of prostate cancer (age dx unk).  Patty Bell's paternal grandmother died at 61 and had a breast lump removed, but she is unsure if this was cancer and how old she was when this occurred.    Patty Bell mother was diagnosed with breast cancer at 10 and is now 4.  Patty Bell has 1 maternal uncle who died of liver cancer dx at 61.  He had no children, and she reports might have had hepatitis.  Patty Bell also has a maternal aunt who is 27 and has cryoglobulinemia.  She has no children.  Patty Bell other maternal aunt is 51 with no history of cancer.  She has 3 daughters and 1 son in their 63's and 82's with no history of cancer. Patty Bell maternal grandparents died in their 21's and 8's with no history of cancer.    Patty Bell is unaware of previous family history of genetic testing for hereditary cancer risks. Patient's maternal ancestors are of [place] descent, and paternal ancestors are of [place] descent. There is no reported Ashkenazi Jewish ancestry. There is no known consanguinity.  GENETIC COUNSELING ASSESSMENT:  We, discussed and recommended the following at today's visit.   DISCUSSION: We reviewed the characteristics, features and inheritance patterns of hereditary cancer syndromes. We also discussed genetic testing, including the appropriate family members to test, the process of testing, insurance coverage and turn-around-time for results. We discussed the implications of a negative, positive and/or variant of uncertain significant result. We offered  Patty Bell the option to pursue genetic testing for the Common Hereditary  Cancer gene panel. + Thyroid cancer Panel.   The Hereditary Gene Panel offered by [facility] includes sequencing and/or deletion duplication testing of the following 47 genes: APC, ATM, AXIN2, BARD1, BMPR1A, BRCA1, BRCA2, BRIP1, CDH1, CDKN2A (p14ARF), CDKN2A (p16INK4a), CKD4, CHEK2, CTNNA1, DICER1, EPCAM (Deletion/duplication testing only), GREM1 (promoter region deletion/duplication testing only), KIT, MEN1, MLH1, MSH2, MSH3, MSH6, MUTYH, NBN, NF1,  NHTL1, PALB2, PDGFRA, PMS2, POLD1, POLE, PTEN, RAD50, RAD51C, RAD51D, SDHB, SDHC, SDHD, SMAD4, SMARCA4. STK11, TP53, TSC1, TSC2, and VHL.  The following genes were evaluated for sequence changes only: SDHA and HOXB13 c.251G>A variant only.   Thyroid Cancer Panel: (11 genes)  APC, CHEK2, DICER1, PRKAR1A, PTEN, RET, TP53, MEN1, SDHB, SDHD, WRN.  We discussed that only 5-10% of cancers are associated with a Hereditary cancer predisposition syndrome.  One of the most common hereditary cancer syndromes that increases breast cancer risk is called Hereditary Breast and Ovarian Cancer (HBOC) syndrome.  This syndrome is caused by mutations in the BRCA1 and BRCA2 genes.  This syndrome increases an individual's lifetime risk to develop breast, ovarian, pancreatic, and other types of cancer.  There are also many other cancer predisposition syndromes caused by mutations in several other genes.  We discussed that if she is found to have a mutation in one of these genes, it may impact future medical management recommendations such as increased cancer screenings and consideration of risk reducing surgeries.  A positive result could also have implications for the patient's family members.  A Negative result would mean we were unable to identify a hereditary component to her cancer, but does not rule out the possibility of a hereditary basis for her family's cancer.  There could be mutations that are undetectable by current technology, or in genes not yet tested or identified to  increase cancer risk.    We discussed the potential to find a Variant of Uncertain Significance or VUS.  These are variants that have not yet been identified as pathogenic or benign, and it is unknown if this variant is associated with increased cancer risk or if this is a normal finding.  Most VUS's are reclassified to benign or likely benign.   It should not be used to make medical management decisions. With time, we suspect the lab Bell determine the significance of any VUS's identified if any.   We discussed with Patty Bell that the family history is not highly consistent with a familial hereditary cancer syndrome, and we feel she is at low risk to harbor a gene mutation associated with such a condition. Without knowing more about her paternal uncle and great uncles' prostate cancers, she does not meet NCCN criteria for genetic testing at this time.  However, if she would like to proceed with genetic testing through the patient pay option ($250) we could coordinate this testing for her.    Based on the patient's personal and family history, the statistical model Tobey Bride Cusik was used to estimate her risk of developing breast cancer. This estimates her lifetime risk of developing breast cancer to be approximately 28.3-33.4%. This range reflects the calcualtion done with her paternal grandmother not being dx with breast cancer and with her paternal grandmother having a diagnosis of breast cancer (Patty Bell is not sure if she did or not). This estimation does not take into account any genetic testing results.  The patient's lifetime breast cancer risk is a preliminary estimate based on available information using one of several models endorsed by the [employer] (ACS). The ACS recommends consideration of breast MRI screening as an adjunct to mammography for patients at high risk (defined as 20% or greater lifetime risk). A more detailed breast cancer risk assessment can be considered, if  clinically indicated.   Patty Bell has been determined to be at high risk for breast cancer.  Therefore, we recommend that annual screening with mammography and breast  MRI begin at age 74, or 10 years prior to the age of breast cancer diagnosis in a relative (whichever is earlier).  We discussed that Patty. Plouff should discuss her individual situation with her referring physician and determine a breast cancer screening plan with which they are both comfortable.      We discussed that some people do not want to undergo genetic testing due to fear of genetic discrimination.  A federal law called the Genetic Information Non-Discrimination Act (GINA) of 2008 helps protect individuals against genetic discrimination based on their genetic test results.  It impacts both health insurance and employment.  For health insurance, it protects against increased premiums, being kicked off insurance or being forced to take a test in order to be insured.  For employment it protects against hiring, firing and promoting decisions based on genetic test results.  Health status due to a cancer diagnosis is not protected under GINA.  This law does not protect life insurance, disability insurance, or other types of insurance.   PLAN: After considering the risks, benefits, and limitations, Patty. Jagodzinski is interested in genetic testing, but would like to think about it and wait until the new year before proceeding with testing.  IShe set up a tentative appointment for a blood draw on Jul 09, 2017 at 8:30AM.  Informed consent was obtained, and we Bell proceed with testing after collecting her blood sample.  If she decides she no longer would like genetic testing, she Bell call to let us know before her blood draw date.    We encouraged Patty. Sessa to remain in contact with cancer genetics annually so that we can continuously update the family history and inform her of any changes in cancer genetics and testing that may be of  benefit for her family. Patty. Doering questions were answered to her satisfaction today. Our contact information was provided should additional questions or concerns arise.  Based on Patty. Caicedo's family history, we recommended her paternal uncle with prostate cancer might meet medical criteria for genetic testing.  Patty. Murph Bell let us know if we can be of any assistance in coordinating genetic counseling and/or testing for this family member.   Lastly, we encouraged Patty. Zeitlin to remain in contact with cancer genetics annually so that we can continuously update the family history and inform her of any changes in cancer genetics and testing that may be of benefit for this family.   Patty.  Armstead questions were answered to her satisfaction today. Our contact information was provided should additional questions or concerns arise. Thank you for the referral and allowing [place] to share in the care of your patient.   Tana Felts, Patty Genetic Counselor [email or web address] phone: [phone]  The patient was seen for a total of 40 minutes in face-to-face genetic counseling.

## 2017-07-09 ENCOUNTER — Inpatient Hospital Stay: Payer: 59 | Attending: Genetic Counselor

## 2017-07-21 ENCOUNTER — Telehealth: Payer: Self-pay | Admitting: Genetics

## 2017-07-21 NOTE — Telephone Encounter (Signed)
Revealed negative genetic testing.  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   This normal result is reassuring and indicates that it is unlikely [name] has a hereditary predisposition to cancer.  However, genetic testing is not perfect, and cannot definitively rule out a hereditary cause.  It will be important for her to keep in contact with genetics to learn if any additional testing may be needed in the future.   Her risk model (using hormone history, breast density/bx history, and family history) estimates her risk to develop breast cancer at about 28-33% (range is depending on weather her paternal grandmother had breast cancer or just benign lumps removed). Therefore we recommend she have increased breast screening (MRI and mammogram every year).

## 2017-07-28 ENCOUNTER — Encounter: Payer: Self-pay | Admitting: Genetics

## 2017-07-28 ENCOUNTER — Ambulatory Visit: Payer: Self-pay | Admitting: Genetics

## 2017-07-28 DIAGNOSIS — Z8042 Family history of malignant neoplasm of prostate: Secondary | ICD-10-CM

## 2017-07-28 DIAGNOSIS — Z1379 Encounter for other screening for genetic and chromosomal anomalies: Secondary | ICD-10-CM | POA: Insufficient documentation

## 2017-07-28 DIAGNOSIS — Z803 Family history of malignant neoplasm of breast: Secondary | ICD-10-CM

## 2017-07-28 NOTE — Progress Notes (Addendum)
HPI: Ms. Mcelmurry was previously seen in the [facility] on 05/27/2018 due to a family history of prostate, breast, skin, thyroid, and liver cancer and concerns regarding a hereditary predisposition to cancer. Please refer to our prior cancer genetics clinic note for more information regarding Ms. Nhan's medical, social and family histories, and our assessment and recommendations, at the time. Ms. Skidgel recent genetic test results were disclosed to her, as well as recommendations warranted by these results. These results and recommendations are discussed in more detail below.  CANCER HISTORY:   No history exists.     FAMILY HISTORY:  We obtained a detailed, 4-generation family history.  Significant diagnoses are listed below: Family History  Problem Relation Age of Onset  . Hyperlipidemia Unknown        parent and grandparent  . Hypertension Unknown   . Heart disease Unknown   . Stroke Unknown   . Breast cancer Mother 80  . Other Maternal Aunt        cryoglobulinemia  . Liver cancer Maternal Uncle        might have hepatitis  . Skin cancer Paternal Uncle   . Other Paternal Grandmother        breast kyno renived, unsure if it was cancer  . Prostate cancer Paternal Uncle 31       had prostate removed  . Prostate cancer Other        age dx unk, died from this cancer  . Prostate cancer Other        age dx unk, died from th is cancer    Ms. Dreibelbis has a 25 year-old son with no history of cancer.  Ms. Moffa has no siblings.   Ms. Frogge father is in his 4's with no history of cancer.  She reports he is taking pills to lower his chance to get prostate cancer.  Ms. Bently has 3 paternal uncles listed below: -1 paternal uncle is 10 and has had a few skin cancers removed from his face (specific type unk).  This uncle has 2 daughters and 2 sons in their 32's-40's.  One son (patient's cousin) was diagnosed with thyroid cancer at 83.   -1 paternal  uncle was diagnosed with prostate cancer at 31 and is now 62.  He had his prostate removed.  He has a son and a daughter (patient's cousins) in their 35's with no history of cancer.  -1 paternal uncle is 50 with no history of cancer.   He has 2 sons and a daughter who are in their 55's and 35's with no history of cancer.  Ms. Adan paternal grandfather died at 13 with no history of cancer.  He had 2 brothers who died of prostate cancer (age dx unk).  Ms Lacks's paternal grandmother died at 50 and had a breast lump removed, but she is unsure if this was cancer and how old she was when this occurred.    Ms. Selke mother was diagnosed with breast cancer at 3 and is now 64.  Ms. Klassen has 1 maternal uncle who died of liver cancer dx at 76.  He had no children, and she reports might have had hepatitis.  Ms. Lukach also has a maternal aunt who is 60 and has cryoglobulinemia.  She has no children.  Ms. Scroggin other maternal aunt is 44 with no history of cancer.  She has 3 daughters and 1 son in their 1's and 56's with no history of cancer.  Ms. Domzalski maternal grandparents died in their 16's and 19's with no history of cancer.    Ms. Dolores is unaware of previous family history of genetic testing for hereditary cancer risks. Patient's maternal ancestors are of [place] descent, and paternal ancestors are of [place] descent. There is no reported Ashkenazi Jewish ancestry. There is no known consanguinity.  GENETIC TEST RESULTS: Genetic testing performed through [facility] Common Hereditary Cancer Panel + Thyroid cancer panel reported out on 06/16/2018 showed no pathogenic mutations. The following genes were evaluated for sequence changes and exonic deletions/duplications: APC, ATM, AXIN2, BARD1, BMPR1A, BRCA1, BRCA2, BRIP1, CDH1, CDK4, CDKN2A (p14ARF), CDKN2A (p16INK4a), CHEK2, CTNNA1, DICER1, EPCAM*, GREM1*, KIT, MEN1, MLH1, MSH2, MSH3, MSH6, MUTYH, NBN, NF1, PALB2, PDGFRA, PMS2,  POLD1, POLE, PRKAR1A, PTEN, RAD50, RAD51C, RAD51D, RET, SDHB, SDHC, SDHD, SMAD4, SMARCA4, STK11, TP53, TSC1, TSC2, VHL, WRN* The following genes were evaluated for sequence changes only: HOXB13*, NTHL1*, SDHA.  A variant of uncertain significance (VUS) in a gene called POLE was also noted. [id number] (Silent)  The test report will be scanned into EPIC and will be located under the Molecular Pathology section of the Results Review tab.A portion of the result report is included below for reference.     We discussed with Ms. Jech that because current genetic testing is not perfect, it is possible there may be a gene mutation in one of these genes that current testing cannot detect, but that chance is small. We also discussed, that there could be another gene that has not yet been discovered, or that we have not yet tested, that is responsible for the cancer diagnoses in the family. It is also possible there is a hereditary cause for the cancer in the family that Ms. Trumbo did not inherit and therefore was not identified in her testing.  Therefore, it is important to remain in touch with cancer genetics in the future so that we can continue to offer Ms. Woodbury the most up to date genetic testing.   Regarding the VUS in POLE: At this time, it is unknown if this variant is associated with increased cancer risk or if this is a normal finding, but most variants such as this get reclassified to being inconsequential. It should not be used to make medical management decisions. With time, we suspect the lab will determine the significance of this variant, if any. If we do learn more about it, we will try to contact Ms. Deloatch to discuss it further. However, it is important to stay in touch with [place] periodically and keep the address and phone number up to date.  ADDITIONAL GENETIC TESTING: This normal result is reassuring and indicates that it is unlikely Ms. Caffee has an increased risk of cancer due  to a mutation in one of these genes.  Therefore, Ms. Mcreynolds was advised to continue following the cancer screening guidelines provided by her primary healthcare providers. Other factors such as her personal and family history may still affect her cancer risk. While reassuring genetic testing cannot definitively rule out a hereditary predisposition to cancer as there is a chance there could be mutations in genes not yet discovered to increase cancer risk or mutations current technology cannot detect.   RISK MODELS: Based on the patient's personal and family history, the statistical models [place] were used to estimate her risk of developing breast cancer. This estimates her lifetime risk of developing breast cancer to be approximately 14--33.1%. This range reflects the calcualtion done with different models  and her paternal grandmother not being dx with breast cancer vs with her paternal grandmother having a diagnosis of breast cancer (Ms. Downen is not sure if she did or not).  The patient's lifetime breast cancer risk is a preliminary estimate based on available information using one of several models endorsed by the [place] (ACS). The ACS recommends consideration of breast MRI screening as an adjunct to mammography for patients at high risk (defined as 20% or greater lifetime risk). A more detailed breast cancer risk assessment can be considered, if clinically indicated.   Ms. Faison has been determined to be at high risk for breast cancer using the [place]. Therefore, we recommend Ms. Willadsen discuss high risk breast screening with her referring provider.  This includes annual screening with mammography and breast MRI begin at age 17, or 10 years prior to the age of breast cancer diagnosis in a relative (whichever is earlier). We discussed that Ms. Mizell should discuss her individual situation with her referring physician and determine a breast cancer  screening plan with which they are both comfortable.      RECOMMENDATIONS FOR FAMILY MEMBERS: Women in this family might be at some increased risk of developing cancer, over the general population risk, simply due to the family history of cancer. We recommended women in this family have a yearly mammogram beginning at age 67, or 68 years younger than the earliest onset of cancer, an annual clinical breast exam, and perform monthly breast self-exams. Women in this family should also have a gynecological exam as recommended by their primary provider. All family members should have a colonoscopy by age 37.  All family members should inform their physicians about the family history of cancer so their doctors can make the most appropriate screening recommendations for them.   Based on Ms. Wank's family history, we recommended her paternal uncle with a diagnosis of prostate cancer, have genetic counseling and testing. Ms. Stowell will let us know if we can be of any assistance in coordinating genetic counseling and/or testing for these family members.   FOLLOW-UP: Lastly, we discussed with Ms. Pinkus that cancer genetics is a rapidly advancing field and it is possible that new genetic tests will be appropriate for her and/or her family members in the future. We encouraged her to remain in contact with cancer genetics on an annual basis so we can update her personal and family histories and let her know of advances in cancer genetics that may benefit this family.   Our contact number was provided. Ms. Crocket questions were answered to her satisfaction, and she knows she is welcome to call us at anytime with additional questions or concerns.   Ferol Luz, MS Genetic Counselor [email or web address]

## 2017-08-07 ENCOUNTER — Other Ambulatory Visit: Payer: Self-pay | Admitting: Obstetrics and Gynecology

## 2017-08-07 DIAGNOSIS — Z803 Family history of malignant neoplasm of breast: Secondary | ICD-10-CM

## 2017-08-12 ENCOUNTER — Telehealth: Payer: Self-pay | Admitting: Genetics

## 2017-08-18 NOTE — Telephone Encounter (Signed)
Patient called to discuss update in the family history.  [name]'s mother recently had extensive imaging and her doctors were worried her cancer was back and was potentially metastatic.  Originally her ovary and 'track leading to her liver' was abnormal.  However after further analysis it looked like this may not be cancer.  Her mother is going to be re-imaged in 3 months to try to determine more information.  [name] wanted to let us know about this to see if it may change any recommendations we made for her.  I informed her that if this is determined to be ovarian cancer, we would recommend herm other have genetic testing, but that her genetic testing analyzed all of the ovarian cancer genes we currently are aware of.  However, she would need to inform her physcians about the family history to see if it changes any recommendations.  She told me she spoke with her gyn about doing breast MRI's based on her risk model estimate and they have decided to do high risk breast screening.  [name] also mentioned that she remembered she had a colon polyp removed as a baby that she forgot about.  I informed her that while an uncertain variant was identified in a colon cancer risk gene, there were not mutations identified in any of the colon cancer risk genes analyzed on genetic testing.  I recommended she inform her PCP about the history of her colon polyp (she does not remember what type it was) to see if this may impact her colonoscopy recommendations.   [name] said her questions were answered and she would continue to update us regarding changes in the family history.  We welcomed her to reach out every year or so to check in for any updates in the field and if any new testing or information may be appropriate for her in the future.

## 2017-09-14 ENCOUNTER — Ambulatory Visit
Admission: RE | Admit: 2017-09-14 | Discharge: 2017-09-14 | Disposition: A | Discharge status: Home / Self Care | Payer: 59 | Source: Ambulatory Visit | Attending: Obstetrics and Gynecology | Admitting: Obstetrics and Gynecology

## 2017-09-14 DIAGNOSIS — Z803 Family history of malignant neoplasm of breast: Secondary | ICD-10-CM

## 2017-09-14 MED ORDER — GADOBENATE DIMEGLUMINE 529 MG/ML IV SOLN
19.0000 mL | Freq: Once | INTRAVENOUS | Status: AC | PRN
  Administered 2017-09-14: 19 mL via INTRAVENOUS

## 2017-09-18 ENCOUNTER — Other Ambulatory Visit: Payer: Self-pay | Admitting: Obstetrics and Gynecology

## 2017-09-18 DIAGNOSIS — R9389 Abnormal findings on diagnostic imaging of other specified body structures: Secondary | ICD-10-CM

## 2017-09-19 ENCOUNTER — Ambulatory Visit
Admission: RE | Admit: 2017-09-19 | Discharge: 2017-09-19 | Disposition: A | Discharge status: Home / Self Care | Payer: 59 | Source: Ambulatory Visit | Attending: Obstetrics and Gynecology | Admitting: Obstetrics and Gynecology

## 2017-09-19 DIAGNOSIS — R9389 Abnormal findings on diagnostic imaging of other specified body structures: Secondary | ICD-10-CM

## 2017-09-22 ENCOUNTER — Other Ambulatory Visit: Payer: Self-pay | Admitting: Obstetrics and Gynecology

## 2017-09-22 DIAGNOSIS — R9389 Abnormal findings on diagnostic imaging of other specified body structures: Secondary | ICD-10-CM

## 2017-10-06 ENCOUNTER — Ambulatory Visit
Admission: RE | Admit: 2017-10-06 | Discharge: 2017-10-06 | Disposition: A | Discharge status: Home / Self Care | Payer: 59 | Source: Ambulatory Visit | Attending: Obstetrics and Gynecology | Admitting: Obstetrics and Gynecology

## 2017-10-06 DIAGNOSIS — R9389 Abnormal findings on diagnostic imaging of other specified body structures: Secondary | ICD-10-CM

## 2017-10-06 MED ORDER — GADOBENATE DIMEGLUMINE 529 MG/ML IV SOLN: 19.0000 mL | Freq: Once | INTRAVENOUS | Status: DC | PRN

## 2018-01-14 NOTE — Progress Notes (Signed)
Chief Complaint  Patient presents with  . Toe Pain    Left toe pain - started after running Monday night. Not sure if she injured it while running or if her son stepping on her feet constantly coul dhave injured it. Looks as though her toenail turning darker in color and she thinks the nail might fall off. Visible redness and swelling to toe -- Left 2nd toe.     HPI: Patty Bell 42 y.o.  sda   Onset  This week  Of pain better today but  More red than usual runs and active and ofetn gets toenail trauma that  Causes nail to fall off .  But not ususlaly this painful .   noi fever  Manipulation or other trauma . ROS: See pertinent positives and negatives per HPI.  Past Medical History:  Diagnosis Date  . Asthma    as a child  . Family history of breast cancer   . Family history of prostate cancer   . Plantar fasciitis   . 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 Scoliosis    bracing    Family History  Problem Relation Age of Onset  . Hyperlipidemia Unknown        parent and grandparent  . Hypertension Unknown   . Heart disease Unknown   . Stroke Unknown   . Breast cancer Mother 65  . Other Maternal Aunt        cryoglobulinemia  . Liver cancer Maternal Uncle        might have hepatitis  . Skin cancer Paternal Uncle   . Other Paternal Grandmother        breast kyno renived, unsure if it was cancer  . Prostate cancer Paternal Uncle 16       had prostate removed  . Prostate cancer Other        age dx unk, died from this cancer  . Prostate cancer Other        age dx unk, died from th is cancer    Social History   Socioeconomic History  . Marital status: Married    Spouse name: Not on file  . Number of children: Not on file  . Years of education: Not on file  . Highest education level: Not on file  Occupational History  . Not on file  Social Needs  . Financial resource strain: Not on file  . Food insecurity:    Worry: Not on file    Inability: Not  on file  . Transportation needs:    Medical: Not on file    Non-medical: Not on file  Tobacco Use  . Smoking status: Never Smoker  . Smokeless tobacco: Never Used  Substance and Sexual Activity  . Alcohol use: Yes  . Drug use: No  . Sexual activity: Not on file  Lifestyle  . Physical activity:    Days per week: Not on file    Minutes per session: Not on file  . Stress: Not on file  Relationships  . Social connections:    Talks on phone: Not on file    Gets together: Not on file    Attends religious service: Not on file    Active member of club or organization: Not on file    Attends meetings of clubs or organizations: Not on file    Relationship status: Not on file  Other Topics Concern  . 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   No facility-administered medications prior to visit.      EXAM:  BP 128/80 (BP Location: Right Arm, Patient Position: Sitting, Cuff Size: Normal)   Pulse 77   Temp 98.3 F (36.8 C) (Oral)   Wt 207 lb 12.8 oz (94.3 kg)   BMI 31.14 kg/m   Body mass index is 31.14 kg/m.  GENERAL: vitals reviewed and listed above, alert, oriented, appears well hydrated and in no acute distress HEENT: atraumatic, conjunctiva  clear, no obvious abnormalities on inspection of external nose and ears MS: moves all extremities  Left middle toe with  2+ erythema around base of nail  Medially and some  Selling at  tuft .   Mild tenderness   No fluctuance and mail intact . ( painted )  No bruising and NV seem intact .   Gait is normal  PSYCH: pleasant and cooperative, no obvious depression or anxiety  ASSESSMENT AND PLAN:  Discussed the following assessment and plan:  Toe pain, left - left middle    Paronychia of third toe of left foot w/o abscess Can take  Keflex but no amox   For 5 days  -Patient advised to return or notify health care team  if symptoms worsen ,persist or new concerns arise.  Patient Instructions  This acts like a mild infection at the base of the nail .  Soak with warm hot water for 10 + minutes  2-3 x per day minimum .  And5 days of antibiotic .   Avoid excess trauma . Should improve in the next 48 - 72 hours .   Sometimes there is infection to drain    Paronychia Paronychia is an infection of the skin that surrounds a nail. It usually affects the skin around a fingernail, but it may also occur near a toenail. It often causes pain and swelling around the nail. This condition may come on suddenly or develop over a longer period. In some cases, a collection of pus (abscess) can form near or under the nail. Usually, paronychia is not serious and it clears up with treatment. What are the causes? This condition may be caused by bacteria or fungi. It is commonly caused by either Streptococcus or Staphylococcus bacteria. The bacteria or fungi often cause the infection by getting into the affected area through an opening in the skin, such as a cut or a hangnail. What increases the risk? This condition is more likely to develop in:  People who get their hands wet often, such as those who work as Fish farm managerdishwashers, bartenders, or nurses.  People who bite their fingernails or suck their thumbs.  People who trim their nails too short.  People who have hangnails or injured fingertips.  People who get manicures.  People who have diabetes.  What are the signs or symptoms? Symptoms of this condition include:  Redness and swelling of the skin near the nail.  Tenderness around the nail when you touch the area.  Pus-filled bumps under the cuticle. The cuticle is the skin at the base or sides of the nail.  Fluid or  pus under the nail.  Throbbing pain in the  area.  How is this diagnosed? This condition is usually diagnosed with a physical exam. In some cases, a sample of pus may be taken from an abscess to be tested in a lab. This can help to determine what type of bacteria or fungi is causing the condition. How is this treated? Treatment for this condition depends on the cause and severity of the condition. If the condition is mild, it may clear up on its own in a few days. Your health care provider may recommend soaking the affected area in warm water a few times a day. When treatment is needed, the options may include:  Antibiotic medicine, if the condition is caused by a bacterial infection.  Antifungal medicine, if the condition is caused by a fungal infection.  Incision and drainage, if an abscess is present. In this procedure, the health care provider will cut open the abscess so the pus can drain out.  Follow these instructions at home:  Soak the affected area in warm water if directed to do so by your health care provider. You may be told to do this for 20 minutes, 2-3 times a day. Keep the area dry in between soakings.  Take medicines only as directed by your health care provider.  If you were prescribed an antibiotic medicine, finish all of it even if you start to feel better.  Keep the affected area clean.  Do not try to drain a fluid-filled bump yourself.  If you will be washing dishes or performing other tasks that require your hands to get wet, wear rubber gloves. You should also wear gloves if your hands might come in contact with irritating substances, such as cleaners or chemicals.  Follow your health care provider's instructions about: ? Wound care. ? Bandage (dressing) changes and removal. Contact a health care provider if:  Your symptoms get worse or do not improve with treatment.  You have a fever or chills.  You have redness spreading from the affected area.  You have continued or increased fluid, blood, or  pus coming from the affected area.  Your finger or knuckle becomes swollen or is difficult to move. This information is not intended to replace advice given to you by your health care provider. Make sure you discuss any questions you have with your health care provider. Document Released: 12/04/2000 Document Revised: 11/16/2015 Document Reviewed: 05/18/2014 Elsevier Interactive Patient Education  2018 [employer].       [place]. Malesha Suliman M.D.

## 2018-01-15 ENCOUNTER — Telehealth: Payer: Self-pay | Admitting: *Deleted

## 2018-01-15 ENCOUNTER — Encounter: Payer: Self-pay | Admitting: Internal Medicine

## 2018-01-15 ENCOUNTER — Ambulatory Visit: Payer: 59 | Admitting: Internal Medicine

## 2018-01-15 VITALS — BP 128/80 | HR 77 | Temp 98.3°F | Wt 207.8 lb

## 2018-01-15 DIAGNOSIS — M79675 Pain in left toe(s): Secondary | ICD-10-CM | POA: Diagnosis not present

## 2018-01-15 DIAGNOSIS — L03032 Cellulitis of left toe: Secondary | ICD-10-CM | POA: Diagnosis not present

## 2018-01-15 MED ORDER — CEPHALEXIN 500 MG PO CAPS: 500.0000 mg | ORAL_CAPSULE | Freq: Four times a day (QID) | ORAL | 0 refills | Status: AC

## 2018-01-15 NOTE — Patient Instructions (Addendum)
This acts like a mild infection at the base of the nail .  Soak with warm hot water for 10 + minutes  2-3 x per day minimum .  And5 days of antibiotic .   Avoid excess trauma . Should improve in the next 48 - 72 hours .   Sometimes there is infection to drain    Paronychia Paronychia is an infection of the skin that surrounds a nail. It usually affects the skin around a fingernail, but it may also occur near a toenail. It often causes pain and swelling around the nail. This condition may come on suddenly or develop over a longer period. In some cases, a collection of pus (abscess) can form near or under the nail. Usually, paronychia is not serious and it clears up with treatment. What are the causes? This condition may be caused by bacteria or fungi. It is commonly caused by either Streptococcus or Staphylococcus bacteria. The bacteria or fungi often cause the infection by getting into the affected area through an opening in the skin, such as a cut or a hangnail. What increases the risk? This condition is more likely to develop in:  People who get their hands wet often, such as those who work as Fish farm manager, bartenders, or nurses.  People who bite their fingernails or suck their thumbs.  People who trim their nails too short.  People who have hangnails or injured fingertips.  People who get manicures.  People who have diabetes.  What are the signs or symptoms? Symptoms of this condition include:  Redness and swelling of the skin near the nail.  Tenderness around the nail when you touch the area.  Pus-filled bumps under the cuticle. The cuticle is the skin at the base or sides of the nail.  Fluid or pus under the nail.  Throbbing pain in the area.  How is this diagnosed? This condition is usually diagnosed with a physical exam. In some cases, a sample of pus may be taken from an abscess to be tested in a lab. This can help to determine what type of bacteria or fungi is  causing the condition. How is this treated? Treatment for this condition depends on the cause and severity of the condition. If the condition is mild, it may clear up on its own in a few days. Your health care provider may recommend soaking the affected area in warm water a few times a day. When treatment is needed, the options may include:  Antibiotic medicine, if the condition is caused by a bacterial infection.  Antifungal medicine, if the condition is caused by a fungal infection.  Incision and drainage, if an abscess is present. In this procedure, the health care provider will cut open the abscess so the pus can drain out.  Follow these instructions at home:  Soak the affected area in warm water if directed to do so by your health care provider. You may be told to do this for 20 minutes, 2-3 times a day. Keep the area dry in between soakings.  Take medicines only as directed by your health care provider.  If you were prescribed an antibiotic medicine, finish all of it even if you start to feel better.  Keep the affected area clean.  Do not try to drain a fluid-filled bump yourself.  If you will be washing dishes or performing other tasks that require your hands to get wet, wear rubber gloves. You should also wear gloves if your hands might come in  contact with irritating substances, such as cleaners or chemicals.  Follow your health care provider's instructions about: ? Wound care. ? Bandage (dressing) changes and removal. Contact a health care provider if:  Your symptoms get worse or do not improve with treatment.  You have a fever or chills.  You have redness spreading from the affected area.  You have continued or increased fluid, blood, or pus coming from the affected area.  Your finger or knuckle becomes swollen or is difficult to move. This information is not intended to replace advice given to you by your health care provider. Make sure you discuss any questions you  have with your health care provider. Document Released: 12/04/2000 Document Revised: 11/16/2015 Document Reviewed: 05/18/2014 Elsevier Interactive Patient Education  [employer]2018 [employer] Inc[employer]

## 2018-01-15 NOTE — Telephone Encounter (Signed)
Sig: Take 1 capsule (500 mg total) by mouth 4 (four) times daily for 10 days.   This would add up to 40 capsules total, but rx was only written for 20 capsules. Per note, patient was to take antibiotic for 5 days, so 20 capsules would be correct quantity, but sig would be incorrect. Please advise.

## 2018-01-15 NOTE — Telephone Encounter (Signed)
Per Dr Fabian SharpPanosh, rx was supposed to read take for 5 days, not 10 days.  Called [facility], corrected the Rx to 1 tab QID x 5 days, #20 caps.  Nothing further needed.

## 2018-01-15 NOTE — Telephone Encounter (Signed)
Copied from CRM [id number]. Topic: General - Other >> Jan 15, 2018  9:14 AM Percival SpanishKennedy, Cheryl W wrote:  Pharmacy said quanity does not match directions    cephALEXin (KEFLEX) 500 MG capsule

## 2018-02-16 NOTE — Progress Notes (Signed)
Chief Complaint  Patient presents with  . Acute Visit    headache that started on thursday and lasted to sunday.  still some pain in the lower back of her head.  during thursday she had moments of not being able to use her words and lots of pressure to her head.  nausea.     HPI: Patty Bell 42 y.o. come in for  Patty Bell  Issues  Had a new heache and although had a very stressful 24 hours before and less sleesp was a scary phenom   Insidious crescendo pattern  . Hurt left  Head and neck and lasted longer than usually 3 days . Went to [facility]  August 25.  And no ear infection   Pain mostly left side and left ear and pressure in back of neck in that area   Found that in conversation on phone  Talk with parents.   Had a hard time thinking  Of  Easy words finding   Very stressed   UsuallyHAs   one day at a time unless a cold.  rx lay down.   Day before  There many things happening  .   Tend to be a worrier  Had some headache when was up. And down   The worse  Left head and   Was able to do a good Presentation   At work after taking  generic  advil    Fine at the meeting and when went  to lunch  Hard  Think of specific words when Gabriel Cirri gto a friend .     Ex  Couldn't remember  Strawberry word and had to say red cake?  LMP  About 2 weeks    ago . nl ROS: See pertinent positives and negatives per HPI. No fever hx of same vision change numbness weaknessw or other neuro sx   Past Medical History:  Diagnosis Date  . Asthma    as a child  . Family history of breast cancer   . Family history of prostate cancer   . Plantar fasciitis   . 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 Scoliosis    bracing    Family History  Problem Relation Age of Onset  . Hyperlipidemia Unknown        parent and grandparent  . Hypertension Unknown   . Heart disease Unknown   . Stroke Unknown   . Breast cancer Mother 37  . Other Maternal Aunt        cryoglobulinemia  . Liver cancer Maternal Uncle    might have hepatitis  . Skin cancer Paternal Uncle   . Other Paternal Grandmother        breast kyno renived, unsure if it was cancer  . Prostate cancer Paternal Uncle 30       had prostate removed  . Prostate cancer Other        age dx unk, died from this cancer  . Prostate cancer Other        age dx unk, died from th is cancer    Social History   Socioeconomic History  . Marital status: Married    Spouse name: Not on file  . Number of children: Not on file  . Years of education: Not on file  . Highest education level: Not on file  Occupational History  . Not on file  Social Needs  . Financial resource strain: Not on file  .  Food insecurity:    Worry: Not on file    Inability: Not on file  . Transportation needs:    Medical: Not on file    Non-medical: Not on file  Tobacco Use  . Smoking status: Never Smoker  . Smokeless tobacco: Never Used  Substance and Sexual Activity  . Alcohol use: Yes  . Drug use: No  . Sexual activity: Not on file  Lifestyle  . Physical activity:    Days per week: Not on file    Minutes per session: Not on file  . Stress: Not on file  Relationships  . Social connections:    Talks on phone: Not on file    Gets together: Not on file    Attends religious service: Not on file    Active member of club or organization: Not on file    Attends meetings of clubs or organizations: Not on file    Relationship status: Not on file  Other Topics Concern  . 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   No facility-administered medications prior to visit.      EXAM:  BP 120/82 (BP Location: Right Arm, Patient Position: Sitting, Cuff Size: Normal)   Pulse 72   Temp 98.3 F (36.8 C) (Oral)   Wt  207 lb 4 oz (94 kg)   SpO2 99%   BMI 31.05 kg/m   Body mass index is 31.05 kg/m.  GENERAL: vitals reviewed and listed above, alert, oriented, appears well hydrated and in no acute distress HEENT: atraumatic, conjunctiva  clear, no obvious abnormalities on inspection of external nose and ears tm clear OP : no lesion edema or exudate  NECK: no obvious masses on inspection palpation  LUNGS: clear to auscultation bilaterally, no wheezes, rales or rhonchi, good air movement CV: HRRR, no clubbing cyanosis or  peripheral edema nl cap refill  MS: moves all extremities without noticeable focal  abnormality PSYCH: pleasant and cooperative nl speech some stress  NEURO: oriented x 3 CN 3-12 appear intact. No focal muscle weakness or atrophy. DTRs symmetrical. Gait WNL.  Grossly non focal. No tremor or abnormal movement.  Lab Results  Component Value Date   WBC 4.9 04/02/2017   HGB 13.4 04/02/2017   HCT 39.2 04/02/2017   PLT 185.0 04/02/2017   GLUCOSE 98 04/02/2017   CHOL 212 (H) 04/02/2017   TRIG 58.0 04/02/2017   HDL 87.50 04/02/2017   LDLDIRECT 103.4 05/14/2011   LDLCALC 112 (H) 04/02/2017   ALT 16 04/02/2017   AST 18 04/02/2017   NA 138 04/02/2017   K 3.9 04/02/2017   CL 103 04/02/2017   CREATININE 0.88 04/02/2017   BUN 14 04/02/2017   CO2 27 04/02/2017   TSH 2.50 04/02/2017   BP Readings from Last 3 Encounters:  02/17/18 120/82  01/15/18 128/80  04/02/17 112/80    ASSESSMENT AND PLAN:  Discussed the following assessment and plan:  Acute nonintractable headache, unspecified headache type - Plan: Ambulatory referral to Neurology  Word finding difficulty assoc with headache? - see  test. - Plan: Ambulatory referral to Neurology Atypical sx with  word apraxia  . Sounds like  a stress triggered  Migraine with atypical nuero sx    couldnt remember the word strawberry or egg when talking with parents .  Exam is reassuring but  Advise   Neuro consult . Will refer . Fu if recurring   -Patient advised to return or notify health care team  if  new concerns arise.  Patient Instructions  This could have been stress  Plus a migraine but because of    The   problem with work findings  Will get opinion.   Can use  600 mg ibuprofen if needed for bad headache in the interim and get back with  [place] if  Recurring in interim .     Neta MendsWanda K. Leanda Padmore M.D.

## 2018-02-17 ENCOUNTER — Encounter: Payer: Self-pay | Admitting: Internal Medicine

## 2018-02-17 ENCOUNTER — Ambulatory Visit: Payer: 59 | Admitting: Internal Medicine

## 2018-02-17 VITALS — BP 120/82 | HR 72 | Temp 98.3°F | Wt 207.2 lb

## 2018-02-17 DIAGNOSIS — R51 Headache: Secondary | ICD-10-CM | POA: Diagnosis not present

## 2018-02-17 DIAGNOSIS — R4789 Other speech disturbances: Secondary | ICD-10-CM | POA: Diagnosis not present

## 2018-02-17 DIAGNOSIS — R519 Headache, unspecified: Secondary | ICD-10-CM

## 2018-02-17 NOTE — Patient Instructions (Signed)
This could have been stress  Plus a migraine but because of    The   problem with work findings  Will get opinion.   Can use  600 mg ibuprofen if needed for bad headache in the interim and get back with  [place] if  Recurring in interim .

## 2018-02-25 ENCOUNTER — Encounter: Payer: Self-pay | Admitting: Neurology

## 2018-04-22 ENCOUNTER — Ambulatory Visit: Payer: 59 | Admitting: Neurology

## 2018-05-27 NOTE — Progress Notes (Signed)
Chief Complaint  Patient presents with  . Annual Exam    Pt has had recent breast biopsy/MRI - normal. Due for another Mammogram    HPI: Patient  Patty Bell  42 y.o. comes in today for Preventive Health Care visit  See above had  Mri and bx last year   mammo was at her gyne office  Gets paps here   Health Maintenance  Topic Date Due  . PAP SMEAR  06/01/2021  . TETANUS/TDAP  06/12/2022  . INFLUENZA VACCINE  Completed  . HIV Screening  Completed   Health Maintenance Review LIFESTYLE:  Exercise:   Run 3 x per week  Tobacco/ETS: no Alcohol:  2 per day  Sugar beverages: no Sleep: 8-9 Drug use: no HH of 3  Work: 50 per week home and  Office,    Periods  Heavier.   Every 28  Days   No birth control.    ROS:  GEN/ HEENT: No fever, significant weight changes sweats headaches vision problems hearing changes, CV/ PULM; No chest pain shortness of breath cough, syncope,edema  change in exercise tolerance. GI /GU: No adominal pain, vomiting, change in bowel habits. No blood in the stool. No significant GU symptoms. SKIN/HEME: ,no acute skin rashes suspicious lesions or bleeding. No lymphadenopathy, nodules, masses.  NEURO/ PSYCH:  No neurologic signs such as weakness numbness. No depression anxiety. IMM/ Allergy: No unusual infections.  Allergy .   REST of 12 system review negative except as per HPI   Past Medical History:  Diagnosis Date  . Asthma    as a child  . Family history of breast cancer   . Family history of prostate cancer   . Plantar fasciitis   . 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 Scoliosis    bracing    Past Surgical History:  Procedure Laterality Date  . CESAREAN SECTION N/A 08/06/2012   Procedure: CESAREAN SECTION;  Surgeon: Luz Lex, MD;  Location: [facility];  Service: Obstetrics;  Laterality: N/A;  . NO PAST SURGERIES      Family History  Problem Relation Age of Onset  . Hyperlipidemia Unknown        parent and grandparent    . Hypertension Unknown   . Heart disease Unknown   . Stroke Unknown   . Breast cancer Mother 54  . Other Maternal Aunt        cryoglobulinemia  . Liver cancer Maternal Uncle        might have hepatitis  . Skin cancer Paternal Uncle   . Other Paternal Grandmother        breast kyno renived, unsure if it was cancer  . Prostate cancer Paternal Uncle 68       had prostate removed  . Prostate cancer Other        age dx unk, died from this cancer  . Prostate cancer Other        age dx unk, died from th is cancer    Social History   Socioeconomic History  . Marital status: Married    Spouse name: Not on file  . Number of children: Not on file  . Years of education: Not on file  . Highest education level: Not on file  Occupational History  . Not on file  Social Needs  . Financial resource strain: Not on file  . Food insecurity:    Worry: Not on file  Inability: Not on file  . Transportation needs:    Medical: Not on file    Non-medical: Not on file  Tobacco Use  . Smoking status: Never Smoker  . Smokeless tobacco: Never Used  Substance and Sexual Activity  . Alcohol use: Yes  . Drug use: No  . Sexual activity: Not on file  Lifestyle  . Physical activity:    Days per week: Not on file    Minutes per session: Not on file  . Stress: Not on file  Relationships  . Social connections:    Talks on phone: Not on file    Gets together: Not on file    Attends religious service: Not on file    Active member of club or organization: Not on file    Attends meetings of clubs or organizations: Not on file    Relationship status: Not on file  Other Topics Concern  . Not on file  Social History Narrative   Married   HH of 3   42 yo old in daycare   Regular exercise- yes   Moved from [place] to [place]    Occupation: a Civil engineer, contracting  grad business degree      Works for [employer]    No pets   Child in day care     No outpatient medications prior to visit.   No  facility-administered medications prior to visit.      EXAM:  BP 128/88 (BP Location: Left Arm, Patient Position: Sitting, Cuff Size: Normal)   Pulse 75   Temp 98.2 F (36.8 C) (Oral)   Ht '5\' 9"'$  (1.753 m)   Wt 208 lb 6.4 oz (94.5 kg)   BMI 30.78 kg/m   Body mass index is 30.78 kg/m. Wt Readings from Last 3 Encounters:  06/01/18 208 lb 6.4 oz (94.5 kg)  02/17/18 207 lb 4 oz (94 kg)  01/15/18 207 lb 12.8 oz (94.3 kg)    Physical Exam: Vital signs reviewed VHQ:IONG is a well-developed well-nourished alert cooperative    who appearsr stated age in no acute distress.  HEENT: normocephalic atraumatic , Eyes: PERRL EOM's full, conjunctiva clear, Nares: paten,t no deformity discharge or tenderness., Ears: no deformity EAC's clear TMs with normal landmarks. Mouth: clear OP, no lesions, edema.  Moist mucous membranes. Dentition in adequate repair. NECK: supple without masses, thyromegaly or bruits. CHEST/PULM:  Clear to auscultation and percussion breath sounds equal no wheeze , rales or rhonchi. No chest wall deformities or tenderness. Breast: normal by inspection . No dimpling, discharge, masses, tenderness or discharge .  Healed scar left breast  CV: PMI is nondisplaced, S1 S2 no gallops, murmurs, rubs. Peripheral pulses are full without delay.No JVD .  ABDOMEN: Bowel sounds normal nontender  No guard or rebound, no hepato splenomegal no CVA tenderness.  No hernia. Extremtities:  No clubbing cyanosis or edema, no acute joint swelling or redness no focal atrophy NEURO:  Oriented x3, cranial nerves 3-12 appear to be intact, no obvious focal weakness,gait within normal limits no abnormal reflexes or asymmetrical SKIN: No acute rashes normal turgor, color, no bruising or petechiae. PSYCH: Oriented, good eye contact, no obvious depression anxiety, cognition and judgment appear normal. LN: no cervical axillary inguinal adenopathy Pelvic: NL ext GU, labia clear without lesions or rash . Vagina  no lesions .Cervix: clear  UTERUS: Neg CMT Adnexa:  clear no masses . PAP done hi riks hpv    Lab Results  Component Value Date   WBC  5.8 06/01/2018   HGB 14.3 06/01/2018   HCT 41.5 06/01/2018   PLT 217.0 06/01/2018   GLUCOSE 92 06/01/2018   CHOL 179 06/01/2018   TRIG 65.0 06/01/2018   HDL 76.20 06/01/2018   LDLDIRECT 103.4 05/14/2011   LDLCALC 89 06/01/2018   ALT 18 06/01/2018   AST 17 06/01/2018   NA 137 06/01/2018   K 4.0 06/01/2018   CL 102 06/01/2018   CREATININE 0.81 06/01/2018   BUN 14 06/01/2018   CO2 26 06/01/2018   TSH 2.17 06/01/2018    BP Readings from Last 3 Encounters:  06/01/18 128/88  02/17/18 120/82  01/15/18 128/80     ASSESSMENT AND PLAN:  Discussed the following assessment and plan:  Visit for preventive health examination - Plan: PAP [[place]], Basic metabolic panel, CBC with Differential/Platelet, Hepatic function panel, Lipid panel, TSH  Encounter for gynecological examination without abnormal finding - Plan: PAP [[place]], Basic metabolic panel, CBC with Differential/Platelet, Hepatic function panel, Lipid panel, TSH  Elevated cholesterol - Plan: PAP [[place]], Basic metabolic panel, CBC with Differential/Platelet, Hepatic function panel, Lipid panel, TSH  Patient Care Team: Burnis Medin, MD as PCP - General (Internal Medicine) Ninetta Lights, MD (Orthopedic Surgery) Molli Posey, MD as Consulting Physician (Obstetrics and Gynecology) Patient Instructions  Continue lifestyle intervention healthy eating and exercise .  Will notify you  of labs when available.  Exam is normal today .   Call [facility] imaging or gyne office to ask about next mammogram.     Preventive Care 40-64 Years, Female Preventive care refers to lifestyle choices and visits with your health care provider that can promote health and wellness. What does preventive care include?  A yearly physical exam. This is also called an annual well  check.  Dental exams once or twice a year.  Routine eye exams. Ask your health care provider how often you should have your eyes checked.  Personal lifestyle choices, including: ? Daily care of your teeth and gums. ? Regular physical activity. ? Eating a healthy diet. ? Avoiding tobacco and drug use. ? Limiting alcohol use. ? Practicing safe sex. ? Taking low-dose aspirin daily starting at age 14. ? Taking vitamin and mineral supplements as recommended by your health care provider. What happens during an annual well check? The services and screenings done by your health care provider during your annual well check will depend on your age, overall health, lifestyle risk factors, and family history of disease. Counseling Your health care provider may ask you questions about your:  Alcohol use.  Tobacco use.  Drug use.  Emotional well-being.  Home and relationship well-being.  Sexual activity.  Eating habits.  Work and work Statistician.  Method of birth control.  Menstrual cycle.  Pregnancy history.  Screening You may have the following tests or measurements:  Height, weight, and BMI.  Blood pressure.  Lipid and cholesterol levels. These may be checked every 5 years, or more frequently if you are over 60 years old.  Skin check.  Lung cancer screening. You may have this screening every year starting at age 3 if you have a 30-pack-year history of smoking and currently smoke or have quit within the past 15 years.  Fecal occult blood test (FOBT) of the stool. You may have this test every year starting at age 68.  Flexible sigmoidoscopy or colonoscopy. You may have a sigmoidoscopy every 5 years or a colonoscopy every 10 years starting at age 17.  Hepatitis C  blood test.  Hepatitis B blood test.  Sexually transmitted disease (STD) testing.  Diabetes screening. This is done by checking your blood sugar (glucose) after you have not eaten for a while (fasting).  You may have this done every 1-3 years.  Mammogram. This may be done every 1-2 years. Talk to your health care provider about when you should start having regular mammograms. This may depend on whether you have a family history of breast cancer.  BRCA-related cancer screening. This may be done if you have a family history of breast, ovarian, tubal, or peritoneal cancers.  Pelvic exam and Pap test. This may be done every 3 years starting at age 66. Starting at age 21, this may be done every 5 years if you have a Pap test in combination with an HPV test.  Bone density scan. This is done to screen for osteoporosis. You may have this scan if you are at high risk for osteoporosis.  Discuss your test results, treatment options, and if necessary, the need for more tests with your health care provider. Vaccines Your health care provider may recommend certain vaccines, such as:  Influenza vaccine. This is recommended every year.  Tetanus, diphtheria, and acellular pertussis (Tdap, Td) vaccine. You may need a Td booster every 10 years.  Varicella vaccine. You may need this if you have not been vaccinated.  Zoster vaccine. You may need this after age 59.  Measles, mumps, and rubella (MMR) vaccine. You may need at least one dose of MMR if you were born in 1957 or later. You may also need a second dose.  Pneumococcal 13-valent conjugate (PCV13) vaccine. You may need this if you have certain conditions and were not previously vaccinated.  Pneumococcal polysaccharide (PPSV23) vaccine. You may need one or two doses if you smoke cigarettes or if you have certain conditions.  Meningococcal vaccine. You may need this if you have certain conditions.  Hepatitis A vaccine. You may need this if you have certain conditions or if you travel or work in places where you may be exposed to hepatitis A.  Hepatitis B vaccine. You may need this if you have certain conditions or if you travel or work in places where  you may be exposed to hepatitis B.  Haemophilus influenzae type b (Hib) vaccine. You may need this if you have certain conditions.  Talk to your health care provider about which screenings and vaccines you need and how often you need them. This information is not intended to replace advice given to you by your health care provider. Make sure you discuss any questions you have with your health care provider. Document Released: 07/07/2015 Document Revised: 02/28/2016 Document Reviewed: 04/11/2015 Elsevier Interactive Patient Education  2018 [employer]. Tonna Palazzi M.D.

## 2018-06-01 ENCOUNTER — Other Ambulatory Visit (HOSPITAL_COMMUNITY)
Admission: RE | Admit: 2018-06-01 | Discharge: 2018-06-01 | Disposition: A | Discharge status: Home / Self Care | Payer: 59 | Source: Ambulatory Visit | Attending: Internal Medicine | Admitting: Internal Medicine

## 2018-06-01 ENCOUNTER — Ambulatory Visit (INDEPENDENT_AMBULATORY_CARE_PROVIDER_SITE_OTHER): Payer: 59 | Admitting: Internal Medicine

## 2018-06-01 ENCOUNTER — Encounter: Payer: Self-pay | Admitting: Internal Medicine

## 2018-06-01 VITALS — BP 128/88 | HR 75 | Temp 98.2°F | Ht 69.0 in | Wt 208.4 lb

## 2018-06-01 DIAGNOSIS — Z01419 Encounter for gynecological examination (general) (routine) without abnormal findings: Secondary | ICD-10-CM

## 2018-06-01 DIAGNOSIS — Z0000 Encounter for general adult medical examination without abnormal findings: Secondary | ICD-10-CM | POA: Insufficient documentation

## 2018-06-01 DIAGNOSIS — E7800 Pure hypercholesterolemia, unspecified: Secondary | ICD-10-CM

## 2018-06-01 LAB — CBC WITH DIFFERENTIAL/PLATELET
BASOS ABS: 0 10*3/uL (ref 0.0–0.1)
Basophils Relative: 0.5 % (ref 0.0–3.0)
EOS PCT: 1.9 % (ref 0.0–5.0)
Eosinophils Absolute: 0.1 10*3/uL (ref 0.0–0.7)
HCT: 41.5 % (ref 36.0–46.0)
Hemoglobin: 14.3 g/dL (ref 12.0–15.0)
LYMPHS ABS: 2 10*3/uL (ref 0.7–4.0)
LYMPHS PCT: 33.8 % (ref 12.0–46.0)
MCHC: 34.5 g/dL (ref 30.0–36.0)
MCV: 94.7 fl (ref 78.0–100.0)
Monocytes Absolute: 0.3 10*3/uL (ref 0.1–1.0)
Monocytes Relative: 5.1 % (ref 3.0–12.0)
NEUTROS ABS: 3.4 10*3/uL (ref 1.4–7.7)
NEUTROS PCT: 58.7 % (ref 43.0–77.0)
Platelets: 217 10*3/uL (ref 150.0–400.0)
RBC: 4.39 Mil/uL (ref 3.87–5.11)
RDW: 11.9 % (ref 11.5–15.5)
WBC: 5.8 10*3/uL (ref 4.0–10.5)

## 2018-06-01 LAB — LIPID PANEL
CHOL/HDL RATIO: 2
CHOLESTEROL: 179 mg/dL (ref 0–200)
HDL: 76.2 mg/dL (ref >39.00)
LDL CALC: 89 mg/dL (ref 0–99)
NonHDL: 102.37
Triglycerides: 65 mg/dL (ref 0.0–149.0)
VLDL: 13 mg/dL (ref 0.0–40.0)

## 2018-06-01 LAB — HEPATIC FUNCTION PANEL
ALT: 18 U/L (ref 0–35)
AST: 17 U/L (ref 0–37)
Albumin: 4.9 g/dL (ref 3.5–5.2)
Alkaline Phosphatase: 50 U/L (ref 39–117)
BILIRUBIN DIRECT: 0.1 mg/dL (ref 0.0–0.3)
BILIRUBIN TOTAL: 0.7 mg/dL (ref 0.2–1.2)
Total Protein: 7.2 g/dL (ref 6.0–8.3)

## 2018-06-01 LAB — BASIC METABOLIC PANEL
BUN: 14 mg/dL (ref 6–23)
CO2: 26 meq/L (ref 19–32)
Calcium: 9.9 mg/dL (ref 8.4–10.5)
Chloride: 102 mEq/L (ref 96–112)
Creatinine, Ser: 0.81 mg/dL (ref 0.40–1.20)
GFR: 82.37 mL/min (ref >60.00)
GLUCOSE: 92 mg/dL (ref 70–99)
POTASSIUM: 4 meq/L (ref 3.5–5.1)
SODIUM: 137 meq/L (ref 135–145)

## 2018-06-01 LAB — TSH: TSH: 2.17 u[IU]/mL (ref 0.35–4.50)

## 2018-06-01 NOTE — Patient Instructions (Addendum)
Continue lifestyle intervention healthy eating and exercise .  Will notify you  of labs when available.  Exam is normal today .   Call [place] imaging or gyne office to ask about next mammogram.     Preventive Care 40-64 Years, Female Preventive care refers to lifestyle choices and visits with your health care provider that can promote health and wellness. What does preventive care include?  A yearly physical exam. This is also called an annual well check.  Dental exams once or twice a year.  Routine eye exams. Ask your health care provider how often you should have your eyes checked.  Personal lifestyle choices, including: ? Daily care of your teeth and gums. ? Regular physical activity. ? Eating a healthy diet. ? Avoiding tobacco and drug use. ? Limiting alcohol use. ? Practicing safe sex. ? Taking low-dose aspirin daily starting at age 2. ? Taking vitamin and mineral supplements as recommended by your health care provider. What happens during an annual well check? The services and screenings done by your health care provider during your annual well check will depend on your age, overall health, lifestyle risk factors, and family history of disease. Counseling Your health care provider may ask you questions about your:  Alcohol use.  Tobacco use.  Drug use.  Emotional well-being.  Home and relationship well-being.  Sexual activity.  Eating habits.  Work and work Statistician.  Method of birth control.  Menstrual cycle.  Pregnancy history.  Screening You may have the following tests or measurements:  Height, weight, and BMI.  Blood pressure.  Lipid and cholesterol levels. These may be checked every 5 years, or more frequently if you are over 36 years old.  Skin check.  Lung cancer screening. You may have this screening every year starting at age 71 if you have a 30-pack-year history of smoking and currently smoke or have quit within the past 15  years.  Fecal occult blood test (FOBT) of the stool. You may have this test every year starting at age 104.  Flexible sigmoidoscopy or colonoscopy. You may have a sigmoidoscopy every 5 years or a colonoscopy every 10 years starting at age 43.  Hepatitis C blood test.  Hepatitis B blood test.  Sexually transmitted disease (STD) testing.  Diabetes screening. This is done by checking your blood sugar (glucose) after you have not eaten for a while (fasting). You may have this done every 1-3 years.  Mammogram. This may be done every 1-2 years. Talk to your health care provider about when you should start having regular mammograms. This may depend on whether you have a family history of breast cancer.  BRCA-related cancer screening. This may be done if you have a family history of breast, ovarian, tubal, or peritoneal cancers.  Pelvic exam and Pap test. This may be done every 3 years starting at age 60. Starting at age 27, this may be done every 5 years if you have a Pap test in combination with an HPV test.  Bone density scan. This is done to screen for osteoporosis. You may have this scan if you are at high risk for osteoporosis.  Discuss your test results, treatment options, and if necessary, the need for more tests with your health care provider. Vaccines Your health care provider may recommend certain vaccines, such as:  Influenza vaccine. This is recommended every year.  Tetanus, diphtheria, and acellular pertussis (Tdap, Td) vaccine. You may need a Td booster every 10 years.  Varicella vaccine. You  may need this if you have not been vaccinated.  Zoster vaccine. You may need this after age 60.  Measles, mumps, and rubella (MMR) vaccine. You may need at least one dose of MMR if you were born in 1957 or later. You may also need a second dose.  Pneumococcal 13-valent conjugate (PCV13) vaccine. You may need this if you have certain conditions and were not previously  vaccinated.  Pneumococcal polysaccharide (PPSV23) vaccine. You may need one or two doses if you smoke cigarettes or if you have certain conditions.  Meningococcal vaccine. You may need this if you have certain conditions.  Hepatitis A vaccine. You may need this if you have certain conditions or if you travel or work in places where you may be exposed to hepatitis A.  Hepatitis B vaccine. You may need this if you have certain conditions or if you travel or work in places where you may be exposed to hepatitis B.  Haemophilus influenzae type b (Hib) vaccine. You may need this if you have certain conditions.  Talk to your health care provider about which screenings and vaccines you need and how often you need them. This information is not intended to replace advice given to you by your health care provider. Make sure you discuss any questions you have with your health care provider. Document Released: 07/07/2015 Document Revised: 02/28/2016 Document Reviewed: 04/11/2015 Elsevier Interactive Patient Education  2018 [employer]vier Inc.  

## 2018-06-04 LAB — CYTOLOGY - PAP
DIAGNOSIS: NEGATIVE
HPV (WINDOPATH): NOT DETECTED

## 2018-06-12 NOTE — Progress Notes (Signed)
t PAP is normal. HPV high  risk is negative

## 2018-08-31 ENCOUNTER — Other Ambulatory Visit: Payer: Self-pay | Admitting: Obstetrics and Gynecology

## 2018-08-31 DIAGNOSIS — R928 Other abnormal and inconclusive findings on diagnostic imaging of breast: Secondary | ICD-10-CM

## 2018-09-04 ENCOUNTER — Ambulatory Visit
Admission: RE | Admit: 2018-09-04 | Discharge: 2018-09-04 | Disposition: A | Discharge status: Home / Self Care | Payer: 59 | Source: Ambulatory Visit | Attending: Obstetrics and Gynecology | Admitting: Obstetrics and Gynecology

## 2018-09-04 ENCOUNTER — Other Ambulatory Visit: Payer: Self-pay

## 2018-09-04 ENCOUNTER — Ambulatory Visit: Payer: 59

## 2018-09-04 DIAGNOSIS — R928 Other abnormal and inconclusive findings on diagnostic imaging of breast: Secondary | ICD-10-CM

## 2019-04-21 IMAGING — MG DIGITAL DIAGNOSTIC UNILATERAL RIGHT MAMMOGRAM WITH TOMO AND CAD
6 series · 6 of 18 positions shown · non-contrast
Comparison: Previous exam(s).

CLINICAL DATA: The patient was called back for a medial right
breast asymmetry.

EXAM:
DIGITAL DIAGNOSTIC UNILATERAL RIGHT MAMMOGRAM WITH CAD AND TOMO

[R CC synth-2D (1 of 2)]
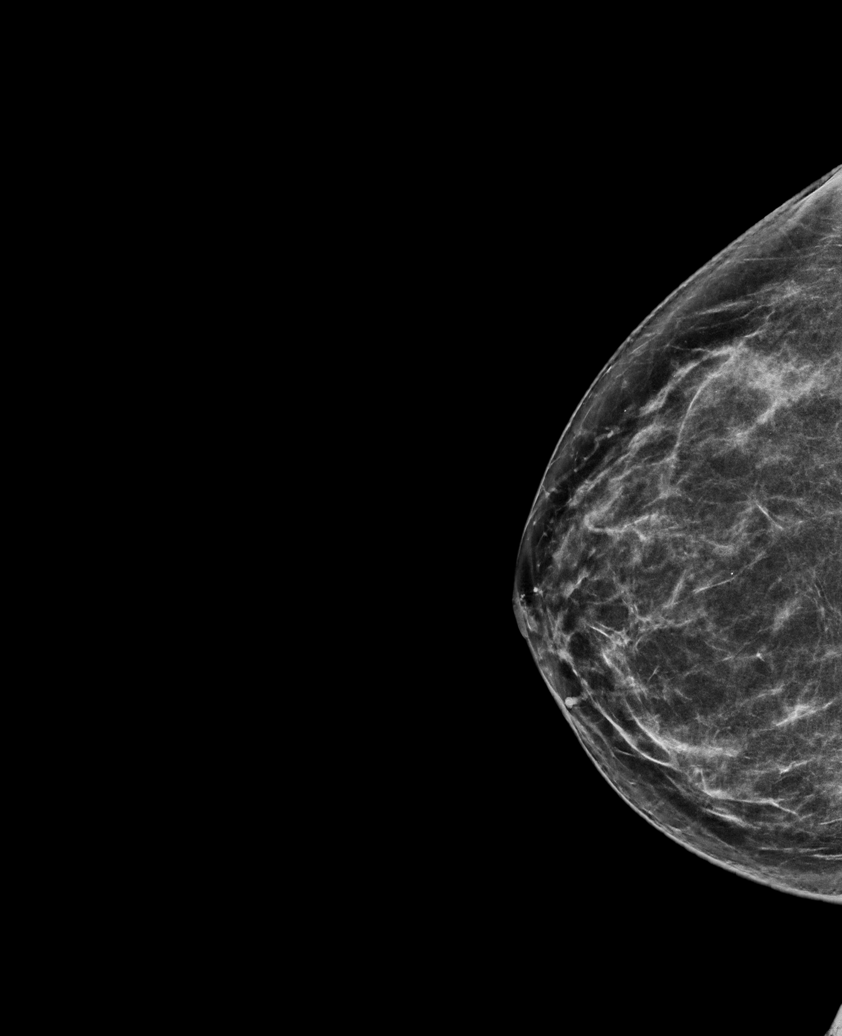

[R CC synth-2D (2 of 2)]
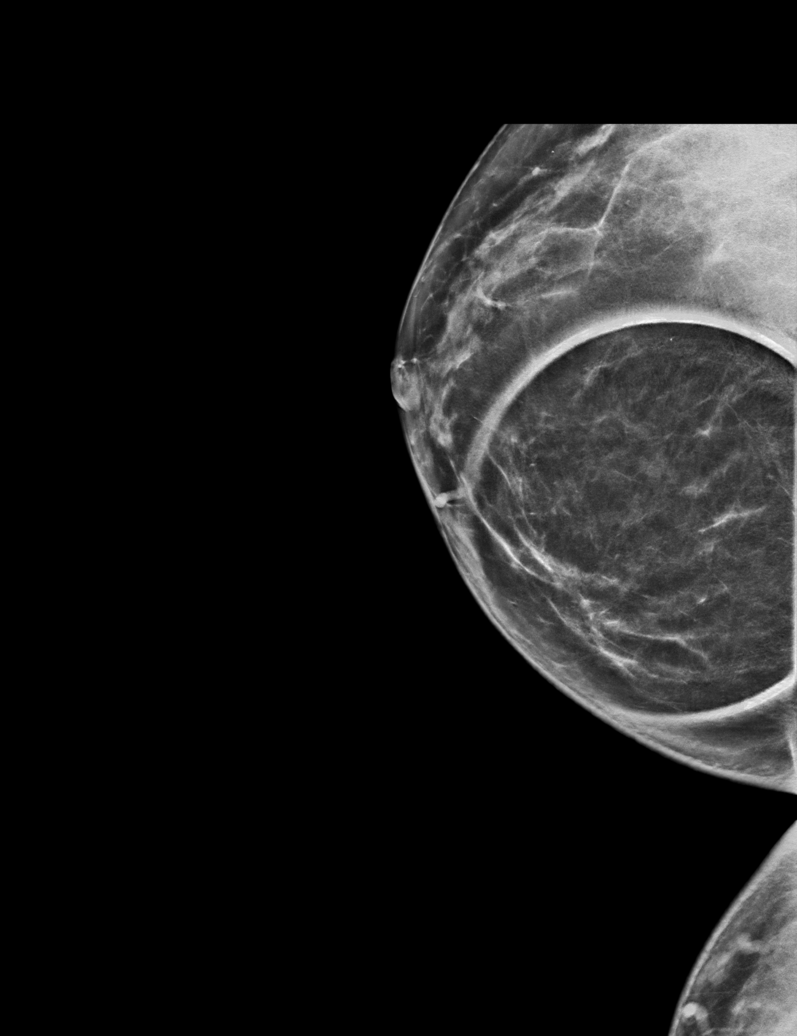

[R ML synth-2D]
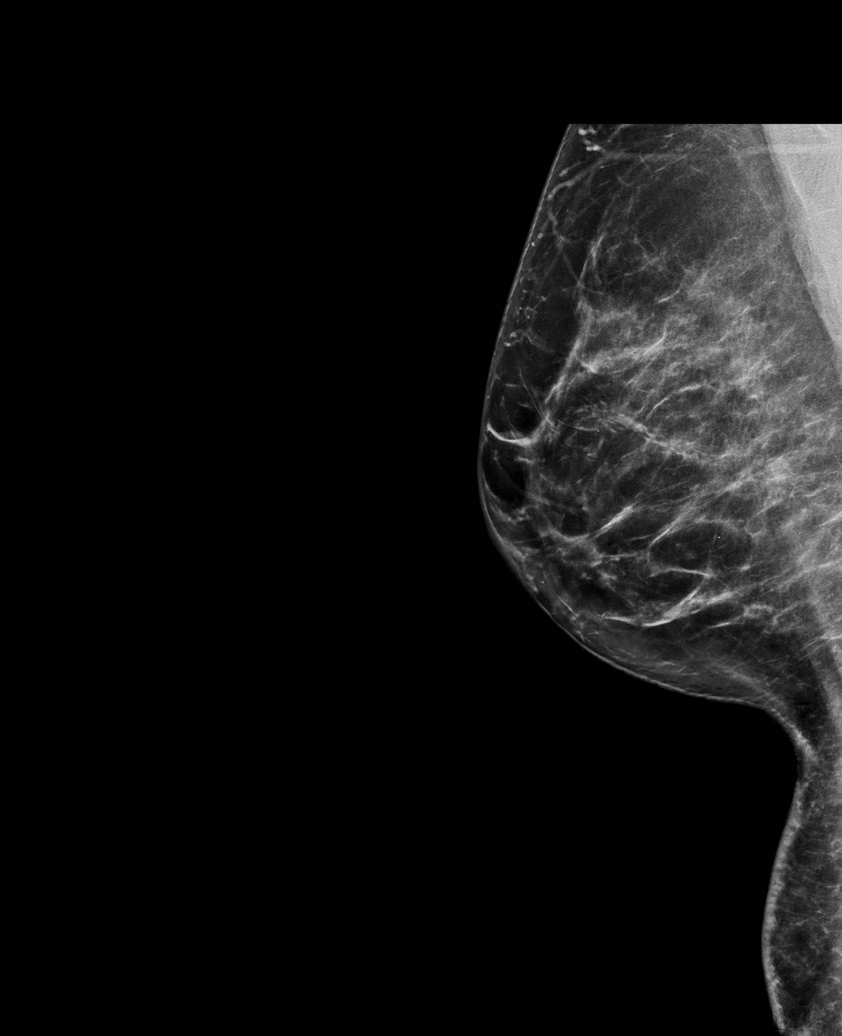

[R CC tomo (1 of 2) · tomo slice 38/75.0]
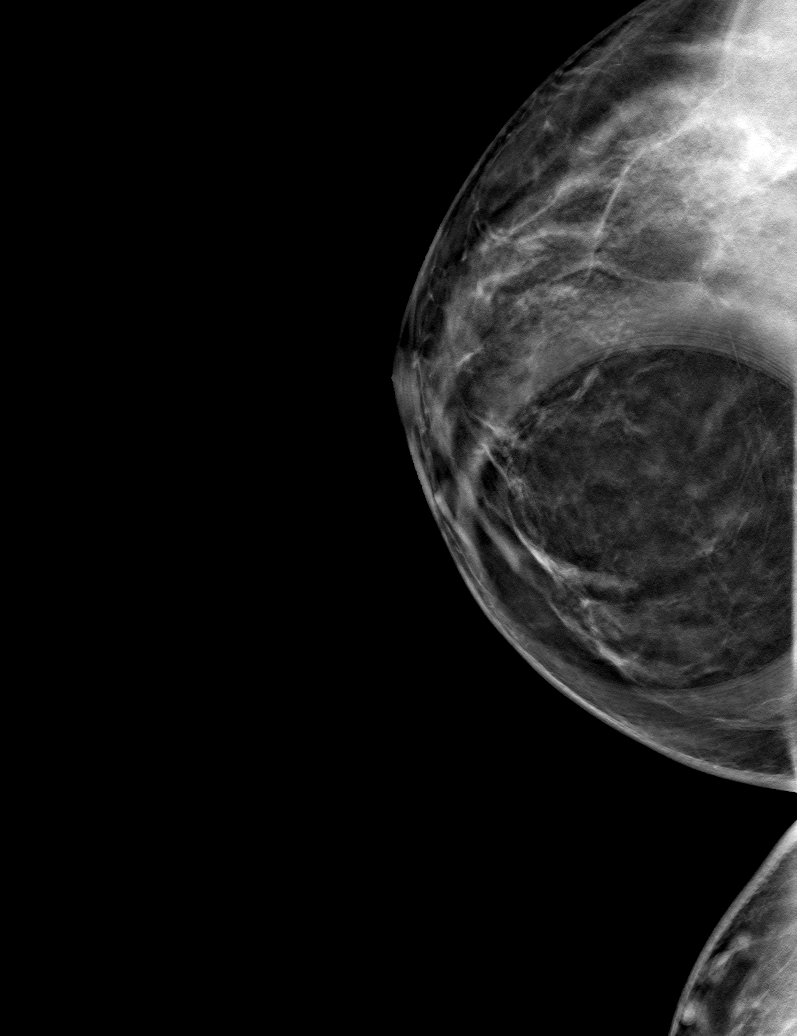

[R CC tomo (2 of 2) · tomo slice 37/74.0]
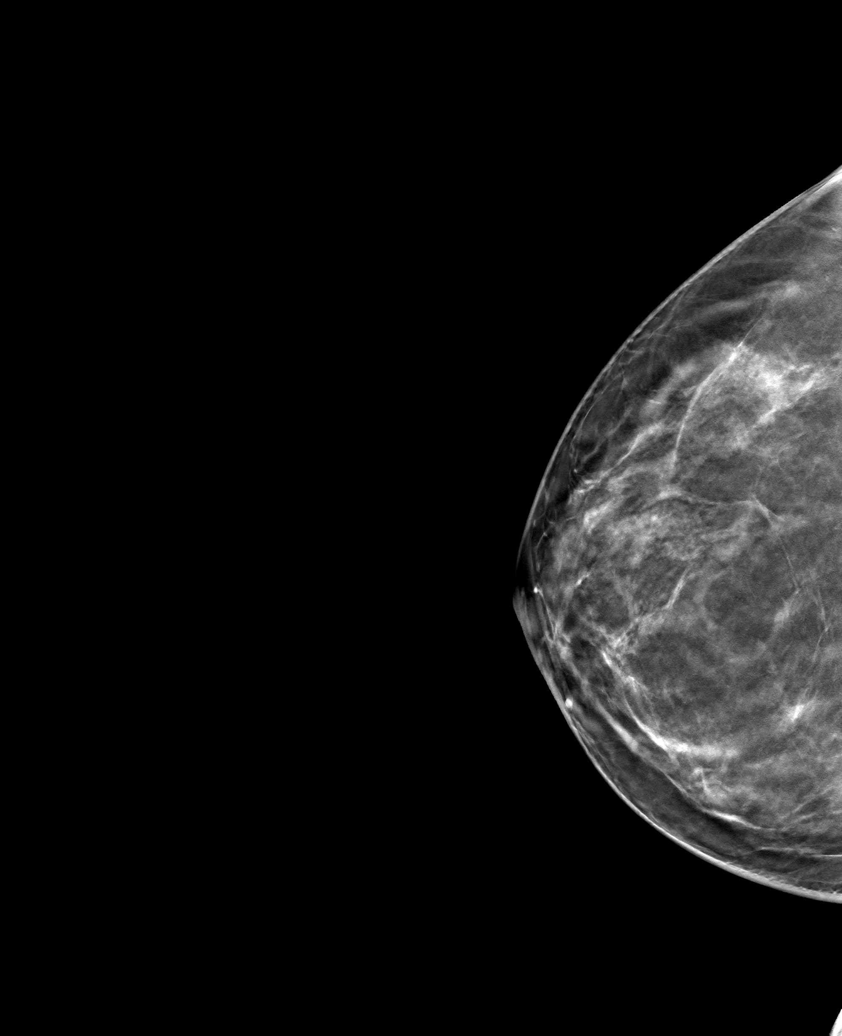

[R ML tomo · tomo slice 43/84.0]
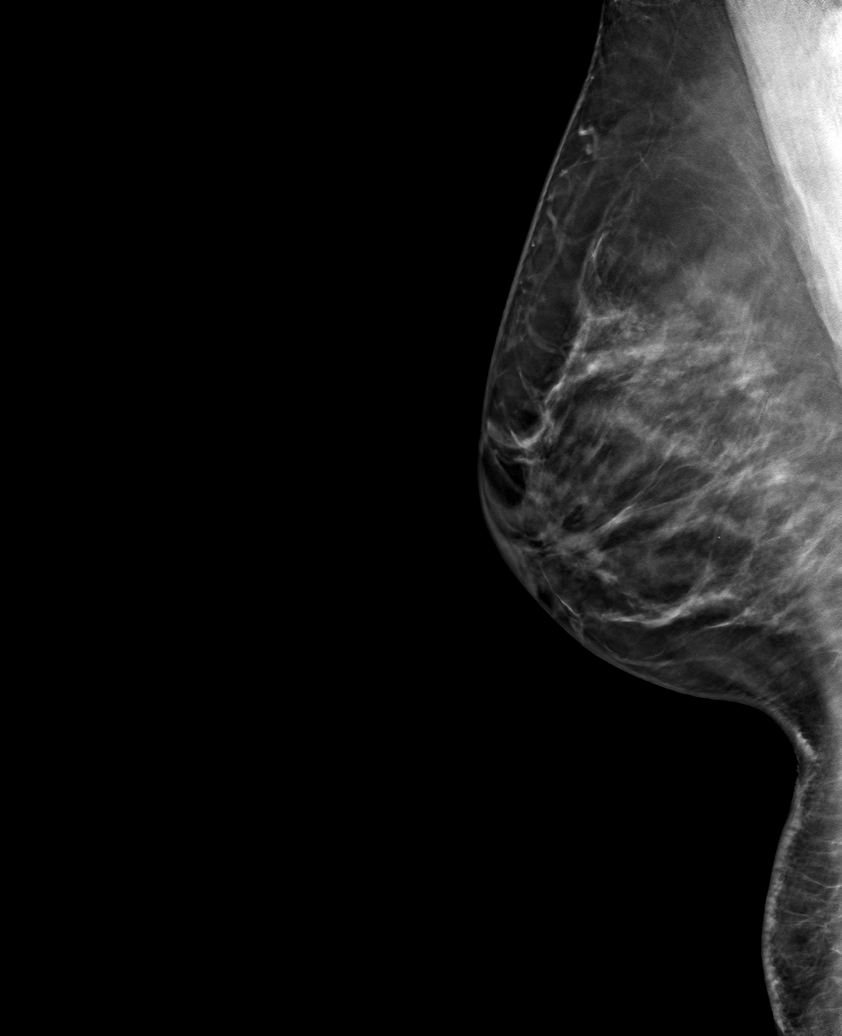

[6 of 18 positions shown; findings below may reference images not displayed]

ACR Breast Density Category c: The breast tissue is heterogeneously
dense, which may obscure small masses.
FINDINGS: The medial asymmetry resolves on additional and repeat imaging.

Mammographic images were processed with CAD.
IMPRESSION: No mammographic evidence of malignancy.

RECOMMENDATION:
Annual screening mammography. Additionally, the patient's lifetime
risk of breast cancer is greater than 20%, by report. As a result,
annual breast MRI should also be pursued. The patient's last breast
MRI was October 06, 2017.

I have discussed the findings and recommendations with the patient.
Results were also provided in writing at the conclusion of the
visit. If applicable, a reminder letter will be sent to the patient
regarding the next appointment.

BI-RADS CATEGORY  2: Benign.

## 2019-05-17 ENCOUNTER — Telehealth (INDEPENDENT_AMBULATORY_CARE_PROVIDER_SITE_OTHER): Payer: 59 | Admitting: Internal Medicine

## 2019-05-17 ENCOUNTER — Encounter: Payer: Self-pay | Admitting: Internal Medicine

## 2019-05-17 ENCOUNTER — Other Ambulatory Visit: Payer: Self-pay

## 2019-05-17 DIAGNOSIS — G930 Cerebral cysts: Secondary | ICD-10-CM

## 2019-05-17 DIAGNOSIS — R569 Unspecified convulsions: Secondary | ICD-10-CM | POA: Diagnosis not present

## 2019-05-17 MED ORDER — GENERIC EXTERNAL MEDICATION
Status: DC
Start: ? — End: 2019-05-17

## 2019-05-17 MED ORDER — SODIUM CHLORIDE 0.9 % IV SOLN
10.00 | INTRAVENOUS | Status: DC
Start: ? — End: 2019-05-17

## 2019-05-17 NOTE — Progress Notes (Signed)
Virtual Visit via Video Note  I connected with@ on 05/17/19 at 10:00 AM EST by a video enabled telemedicine application and verified that I am speaking with the correct person using two identifiers. Location patient: home car Location provider:work office Persons participating in the virtual visit: patient, provider  [place] national recommendations  regarding COVID 19 pandemic   video visit is advised over in office visit for this patient.  Patient aware  of the limitations of evaluation and management by telemedicine and  availability of in person appointments. and agreed to proceed.   HPI: Patty Bell presents for video visit follow-up of emergency room visit yesterday for unwitnessed seizure and. She was standing talking to another parent at her son's soccer practice and then remembers not being able to speak correctly and then waking up in the ambulance on the way to the hospital.  Witnesses said look like a seizure she was evaluated with CT and MRI of the brain in addition to lab work. There was a large cyst possibly epidermoid that may have been causing some pressure on the brain but her neurosurgeon did not feel acute although was treated with steroids put on Keppra and the plan was to be seen by neurology and neurosurgery as outpatient.  She has an appointment December 1 Wants to talk about the situation and concerns. Currently she is taking Keppra and is not driving feels normal but stressed and of course a bit anxious. 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  Of note year ago she was referred to neurology regarding these episodes where she was not thinking right but was doing better and Covid hit and it never happened and got delayed.  She denies any serious type headaches at this time although complained of that a year ago. ROS: See pertinent positives and negatives per HPI.  Past Medical History:  Diagnosis Date  . Asthma    as a child  . Family  history of breast cancer   . Family history of prostate cancer   . Plantar fasciitis   . 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 Scoliosis    bracing    Past Surgical History:  Procedure Laterality Date  . CESAREAN SECTION N/A 08/06/2012   Procedure: CESAREAN SECTION;  Surgeon: Turner Danielsavid C Lowe, MD;  Location: [facility];  Service: Obstetrics;  Laterality: N/A;  . NO PAST SURGERIES      Family History  Problem Relation Age of Onset  . Hyperlipidemia Other        parent and grandparent  . Hypertension Other   . Heart disease Other   . Stroke Other   . Breast cancer Mother [id number]  . Other Maternal Aunt        cryoglobulinemia  . Liver cancer Maternal Uncle        might have hepatitis  . Skin cancer Paternal Uncle   . Other Paternal Grandmother        breast kyno renived, unsure if it was cancer  . Prostate cancer Paternal Uncle 368       had prostate removed  . Prostate cancer Other        age dx unk, died from this cancer  . Prostate cancer Other        age dx unk, died from th is cancer    Social History   Tobacco Use  . Smoking status: Never Smoker  . Smokeless tobacco: Never  Used  Substance Use Topics  . Alcohol use: Yes  . Drug use: No     No current outpatient medications on file.  EXAM: BP Readings from Last 3 Encounters:  06/01/18 128/88  02/17/18 120/82  01/15/18 128/80    VITALS per patient if applicable:  GENERAL: alert, oriented, appears well and in no acute distress  HEENT: atraumatic, conjunttiva clear, no obvious abnormalities on inspection of external nose and ears  NECK: normal movements of the head and neck  LUNGS: on inspection no signs of respiratory distress, breathing rate appears normal, no obvious gross SOB, gasping or wheezing  CV: no obvious cyanosis  MS: moves all visible extremities without noticeable abnormality  PSYCH/NEURO: pleasant and cooperative, mildly anxious but appropriate speech and thought processing grossly  intact somewhat emotional but appropriate for  situraion  Lab Results  Component Value Date   WBC 5.8 06/01/2018   HGB 14.3 06/01/2018   HCT 41.5 06/01/2018   PLT 217.0 06/01/2018   GLUCOSE 92 06/01/2018   CHOL 179 06/01/2018   TRIG 65.0 06/01/2018   HDL 76.20 06/01/2018   LDLDIRECT 103.4 05/14/2011   LDLCALC 89 06/01/2018   ALT 18 06/01/2018   AST 17 06/01/2018   NA 137 06/01/2018   K 4.0 06/01/2018   CL 102 06/01/2018   CREATININE 0.81 06/01/2018   BUN 14 06/01/2018   CO2 26 06/01/2018   TSH 2.17 06/01/2018  ED visit evaluation  Below  IMPRESSION:   5.7 x 3.7 x 4.7 cm lesion in the left middle cranial fossa demonstrating mass effect upon the left temporal lobe with areas of edema in the posterior left temporal lobe mass effect upon the posterior horn of the left ventricle. There is no associated  abnormal enhancement. There is heterogeneous signal noted on both FLAIR and DWI sequence. 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  No intracranial hemorrhage. No acute infarction.  Electronically Signed by: Azzie Almas   ECG:  Patient is hemodynamically stable, and neurovascularly intact. Vital signs are normal. Patient is without fever, tachycardia, hypoxia, or dyspnea. There are no constitutional symptoms. Physical exam unremarkable. NIHSS = 0. CMP, CBC, magnesium, urinalysis, urine drug screen, urine pregnancy, and point-of-care glucose unremarkable. Urine pregnancy negative. CK mildly elevated 237, consistent with recent seizure. EKG reassuring, normal sinus rhythm, ventricular rate 97, no comparison. CT scan concerning for old area of low-density lesion consistent with arachnoid cyst, versus epidermal cyst in the left temporoparietal region. MRI recommended, and completed, supporting likely epidermal cyst. Case discussed with neurosurgeon Dr. Petra Kuba who believes this is likely nonacute, recommends neurology follow-up for new onset seizure, with seizure  precautions, seizure prophylaxis, and follow-up with neurosurgery for epidermal cyst/lesion. IV Keppra, and p.o. Ativan given seizure prophylaxis. IV Decadron given for mass-effect. Acetaminophen given for headache. IV fluids given for hydration. Keppra prescribed. Discussed with patient and husband at bedside all results, as well as symptoms, diagnosis, treatment, medications, seizure prophylaxis, seizure precautions, no driving, close follow-up with neurology for new seizure, and neurosurgery for lesion, and communication with PCP, and/or return for worsening. Referrals given. Scheduler notified to expedite follow-up. Patient verbalizes feeling better, understanding, denies questions or needs, and is agreeable.  ASSESSMENT AND PLAN:  Discussed the following assessment and plan:    ICD-10-CM   1. 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   Expectant management and discussion of plan and treatment with opportunity to ask  questions and all were answered. The patient agreed with the plan and demonstrated an understanding of the instructions.  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 Return for cpx and follow up after eval in dec or jan.  When she is due anyway      Shanon Ace, MD

## 2019-05-17 NOTE — Telephone Encounter (Signed)
Pt made virtual visit  

## 2019-05-25 DIAGNOSIS — G930 Cerebral cysts: Secondary | ICD-10-CM

## 2019-05-26 NOTE — Telephone Encounter (Signed)
Please do referral to Neurosurgeon named    Dx seizure  And   temproal lobe lesion on mri  Let her know

## 2019-06-11 DIAGNOSIS — G930 Cerebral cysts: Secondary | ICD-10-CM

## 2019-06-11 DIAGNOSIS — R569 Unspecified convulsions: Secondary | ICD-10-CM

## 2019-06-12 NOTE — Telephone Encounter (Signed)
Patty Bell   Does she need  [place] to refer for a second opinion neurosurgery   If so please   Arrange this to expedite the matter . ASAP  Dx new onset seizures and brain cyst.

## 2019-06-16 ENCOUNTER — Telehealth: Payer: Self-pay | Admitting: *Deleted

## 2019-06-16 NOTE — Telephone Encounter (Signed)
Copied from [place]. Topic: Referral - Status >> Jun 16, 2019 12:46 PM Erick Blinks wrote: Reason for CRM: Revi from [facility] neurology called to report that they cannot schedule without records of the pt's MRI. Please advise Best contact: [phone]

## 2019-06-16 NOTE — Telephone Encounter (Signed)
Please advise 

## 2019-06-17 ENCOUNTER — Encounter: Payer: Self-pay | Admitting: Internal Medicine

## 2019-06-22 NOTE — Telephone Encounter (Signed)
I dont understand this message . I dont need to order an MRI  . She had it done elsewhere.   Guessing that Patty Bell needs to sign and get  Agency that  Ordered and reviewed MRI for release and get this sent .   IT may be on  Care everywhere.  ?

## 2019-06-22 NOTE — Telephone Encounter (Signed)
Left detailed message.   

## 2019-06-22 NOTE — Telephone Encounter (Signed)
Please advise and if okay order mri

## 2019-06-22 NOTE — Telephone Encounter (Signed)
I did not see that Dr Regis Bill order an MRI that's why I did not send. I can not send another provider notes without the pt sign a medical release within that clinic

## 2019-07-01 ENCOUNTER — Encounter: Payer: Self-pay | Admitting: Internal Medicine

## 2019-07-01 ENCOUNTER — Other Ambulatory Visit: Payer: Self-pay

## 2019-07-01 ENCOUNTER — Telehealth (INDEPENDENT_AMBULATORY_CARE_PROVIDER_SITE_OTHER): Payer: 59 | Admitting: Internal Medicine

## 2019-07-01 DIAGNOSIS — R569 Unspecified convulsions: Secondary | ICD-10-CM | POA: Diagnosis not present

## 2019-07-01 DIAGNOSIS — G930 Cerebral cysts: Secondary | ICD-10-CM | POA: Diagnosis not present

## 2019-07-01 NOTE — Progress Notes (Signed)
Virtual Visit via Video Note  I connected with@ on 07/02/19 at  8:30 AM EST by a video enabled telemedicine application and verified that I am speaking with the correct person using two identifiers. Location patient:work Location provider: home office Persons participating in the virtual visit: patient, provider  [place] national recommendations  regarding COVID 19 pandemic   video visit is advised over in office visit for this patient.  Patient aware  of the limitations of evaluation and management by telemedicine and  availability of in person appointments. and agreed to proceed.   HPI: Patty Bell presents for video to discuss the situation and referrals in regard to her brain cyst that caused a seizure and consultations about surgery. She is waiting on the referral from Dr. Tommi Rumps.  She had talked to Dr. Saintclair Halsted and original neurosurgeon through [facility] but has many questions and concerns and was given some names about second opinions regarding advisability of surgery health surgery be done risk of loss to her speech and language area.  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 1 surgeon had advised that perhaps doing the surgery awake to avoid and minimize any potential collateral damage.  She has not had any seizures recently except original.  Works as Chief Financial Officer  And of course concerned about risks and implications of speech loss related to her profession and career.  ROS: See pertinent positives and negatives per HPI.  Past Medical History:  Diagnosis Date  . Asthma    as a child  . Family history of breast cancer   . Family history of prostate cancer   . Plantar fasciitis   . 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 Scoliosis    bracing    Past Surgical History:  Procedure Laterality Date  . CESAREAN SECTION N/A 08/06/2012   Procedure: CESAREAN SECTION;  Surgeon: Luz Lex, MD;  Location: [facility];  Service: Obstetrics;  Laterality: N/A;  . NO PAST SURGERIES      Family History  Problem Relation Age of Onset  . Hyperlipidemia Other        parent and grandparent  . Hypertension Other   . Heart disease Other   . Stroke Other   . Breast cancer Mother 20  . Other Maternal Aunt        cryoglobulinemia  . Liver cancer Maternal Uncle        might have hepatitis  . Skin cancer Paternal Uncle   . Other Paternal Grandmother        breast kyno renived, unsure if it was cancer  . Prostate cancer Paternal Uncle 71       had prostate removed  . Prostate cancer Other        age dx unk, died from this cancer  . Prostate cancer Other        age dx unk, died from th is cancer     No current outpatient medications on file.  EXAM: BP Readings from Last 3 Encounters:  06/01/18 128/88  02/17/18 120/82  01/15/18 128/80    VITALS per patient if applicable:  GENERAL: alert, oriented, appears well and in no acute distress Looks well well spoken  HEENT: atraumatic, conjunttiva clear, no obvious abnormalities on inspection of external nose and ears  NECK: normal movements of the head and neck  LUNGS: on inspection no signs of  respiratory distress, breathing rate appears normal, no obvious gross SOB, gasping or wheezing  CV: no obvious cyanosis  MS: moves all visible extremities without noticeable abnormality  PSYCH/NEURO: pleasant and cooperative, speech and thought processing grossly intact   ASSESSMENT AND PLAN:  Discussed the following assessment and plan:    ICD-10-CM   1. 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 Seizure (HCC)  R56.9 Ambulatory referral to Neurosurgery   no recurrance    We discussed and agreed of getting second opinions for the multiple questions that she has surgery versus no surgery and observation type of surgery awake not and risks benefits. Referral reviewed and will be confirmed and sent but she will  need to make sure that the records from [facility] her evaluation and the MRI scan information or visual is sent to  neurosurgeon for evaluation.  Counseled.   At this time would not get a new neurologist opinion since stable and  questions would be best answered through the neurosurgeon consult.   Expectant management and discussion of plan and treatment with opportunity to ask questions and all were answered. The patient agreed with the plan and demonstrated an understanding of the instructions.   Advised to call back or seek an in-person evaluation if worsening  or having  further concerns . Return as needed for next step. I provided 33 minutes of non-face-to-face time during this encounter.  revew  Referrals and information   Berniece Andreas, MD

## 2019-08-10 MED ORDER — DEXAMETHASONE 4 MG PO TABS
4.00 | ORAL_TABLET | ORAL | Status: DC
Start: 2019-08-10 — End: 2019-08-10

## 2019-08-10 MED ORDER — SENNOSIDES-DOCUSATE SODIUM 8.6-50 MG PO TABS
2.00 | ORAL_TABLET | ORAL | Status: DC
Start: 2019-08-10 — End: 2019-08-10

## 2019-08-10 MED ORDER — PANTOPRAZOLE SODIUM 40 MG PO TBEC
40.00 | DELAYED_RELEASE_TABLET | ORAL | Status: DC
Start: 2019-08-11 — End: 2019-08-10

## 2019-08-10 MED ORDER — LEVETIRACETAM 500 MG PO TABS
1000.00 | ORAL_TABLET | ORAL | Status: DC
Start: 2019-08-10 — End: 2019-08-10

## 2019-08-10 MED ORDER — ONDANSETRON HCL 4 MG/2ML IJ SOLN
4.00 | INTRAMUSCULAR | Status: DC
Start: ? — End: 2019-08-10

## 2019-08-10 MED ORDER — OXYCODONE HCL 5 MG PO TABS
5.00 | ORAL_TABLET | ORAL | Status: DC
Start: ? — End: 2019-08-10

## 2019-08-10 MED ORDER — ACETAMINOPHEN 325 MG PO TABS
975.00 | ORAL_TABLET | ORAL | Status: DC
Start: 2019-08-10 — End: 2019-08-10

## 2019-08-10 MED ORDER — GENERIC EXTERNAL MEDICATION
Status: DC
Start: ? — End: 2019-08-10

## 2019-08-10 MED ORDER — HEPARIN SODIUM (PORCINE) 5000 UNIT/ML IJ SOLN
5000.00 | INTRAMUSCULAR | Status: DC
Start: ? — End: 2019-08-10

## 2019-08-27 ENCOUNTER — Ambulatory Visit: Payer: 59 | Attending: Internal Medicine

## 2019-08-27 DIAGNOSIS — Z23 Encounter for immunization: Secondary | ICD-10-CM | POA: Insufficient documentation

## 2019-08-27 NOTE — Progress Notes (Signed)
   Covid-19 Vaccination Clinic  Name:  Patty Bell    MRN: [id number] DOB: 01-09-1976  08/27/2019  Patty Bell was observed post Covid-19 immunization for 15 minutes without incident. She was provided with Vaccine Information Sheet and instruction to access the V-Safe system.   Patty Bell was instructed to call 911 with any severe reactions post vaccine: Marland Kitchen Difficulty breathing  . Swelling of face and throat  . A fast heartbeat  . A bad rash all over body  . Dizziness and weakness   Immunizations Administered    Name Date Dose VIS Date Route   Pfizer COVID-19 Vaccine 08/27/2019 11:22 AM 0.3 mL 06/04/2019 Intramuscular   Manufacturer: [employer], [employer]   Lot: [id number]   NDC: [address]-1

## 2019-09-22 ENCOUNTER — Ambulatory Visit: Payer: 59 | Attending: Internal Medicine

## 2019-09-22 DIAGNOSIS — Z23 Encounter for immunization: Secondary | ICD-10-CM

## 2019-09-22 NOTE — Progress Notes (Signed)
   Covid-19 Vaccination Clinic  Name:  Patty Bell    MRN: [id number] DOB: [birth year]  09/22/2019  [name] was observed post Covid-19 immunization for 15 minutes without incident. She was provided with Vaccine Information Sheet and instruction to access the V-Safe system.   [name] was instructed to call 911 with any severe reactions post vaccine: Marland Kitchen Difficulty breathing  . Swelling of face and throat  . A fast heartbeat  . A bad rash all over body  . Dizziness and weakness   Immunizations Administered    Name Date Dose VIS Date Route   Pfizer COVID-19 Vaccine 09/22/2019 10:50 AM 0.3 mL 06/04/2019 Intramuscular   Manufacturer: [employer], [employer]   Lot: [id number]   NDC: [address]-8

## 2019-11-02 LAB — BASIC METABOLIC PANEL
BUN: 12 (ref 4–21)
CO2: 25 — AB (ref 13–22)
Chloride: 109 — AB (ref 99–108)
Creatinine: 1.1 (ref <=1.1)
Glucose: 106
Potassium: 3.7 (ref 3.4–5.3)
Sodium: 143 (ref 137–147)

## 2019-11-02 LAB — HEPATIC FUNCTION PANEL
ALT: 50 — AB (ref 7–35)
AST: 30 (ref 13–35)
Alkaline Phosphatase: 43 (ref 25–125)
Bilirubin, Total: 1.1

## 2019-11-02 LAB — CBC AND DIFFERENTIAL
HCT: 37 (ref 36–46)
Hemoglobin: 12.5 (ref 12.0–16.0)
Neutrophils Absolute: 78
Platelets: 134 — AB (ref 150–399)
WBC: 4.1

## 2019-11-02 LAB — COMPREHENSIVE METABOLIC PANEL
Albumin: 4.6 (ref 3.5–5.0)
Calcium: 9.4 (ref 8.7–10.7)

## 2019-11-02 LAB — CBC: RBC: 3.97 (ref 3.87–5.11)

## 2020-01-03 LAB — BASIC METABOLIC PANEL
BUN: 16 (ref 4–21)
CO2: 25 — AB (ref 13–22)
Chloride: 108 (ref 99–108)
Creatinine: 0.8 (ref <=1.1)
Glucose: 94
Potassium: 3.9 (ref 3.4–5.3)
Sodium: 140 (ref 137–147)

## 2020-01-03 LAB — CBC AND DIFFERENTIAL
HCT: 35 — AB (ref 36–46)
Hemoglobin: 11.9 — AB (ref 12.0–16.0)
Neutrophils Absolute: 68
Platelets: 112 — AB (ref 150–399)
WBC: 2.8

## 2020-01-03 LAB — HEPATIC FUNCTION PANEL
ALT: 22 (ref 7–35)
AST: 18 (ref 13–35)
Alkaline Phosphatase: 35 (ref 25–125)
Bilirubin, Total: 1

## 2020-01-03 LAB — CBC: RBC: 3.69 — AB (ref 3.87–5.11)

## 2020-01-03 LAB — COMPREHENSIVE METABOLIC PANEL: Calcium: 9.3 (ref 8.7–10.7)

## 2020-02-29 LAB — CBC: RBC: 3.85 — AB (ref 3.87–5.11)

## 2020-02-29 LAB — HEPATIC FUNCTION PANEL
ALT: 15 (ref 7–35)
AST: 17 (ref 13–35)
Alkaline Phosphatase: 41 (ref 25–125)
Bilirubin, Total: 1.2

## 2020-02-29 LAB — CBC AND DIFFERENTIAL
HCT: 37 (ref 36–46)
Hemoglobin: 13.2 (ref 12.0–16.0)
Neutrophils Absolute: 81
Platelets: 124 — AB (ref 150–399)
WBC: 3.6

## 2020-02-29 LAB — BASIC METABOLIC PANEL
BUN: 14 (ref 4–21)
CO2: 22 (ref 13–22)
Chloride: 104 (ref 99–108)
Creatinine: 0.9 (ref <=1.1)
Glucose: 105
Potassium: 3.8 (ref 3.4–5.3)
Sodium: 138 (ref 137–147)

## 2020-02-29 LAB — COMPREHENSIVE METABOLIC PANEL
Albumin: 4.4 (ref 3.5–5.0)
Calcium: 9.4 (ref 8.7–10.7)
GFR calc Af Amer: 91
GFR calc non Af Amer: 78

## 2020-11-01 ENCOUNTER — Ambulatory Visit: Admission: EM | Admit: 2020-11-01 | Discharge: 2020-11-01 | Discharge status: Against Medical Advice | Payer: 59

## 2020-11-01 ENCOUNTER — Other Ambulatory Visit: Payer: Self-pay

## 2021-01-16 ENCOUNTER — Encounter: Payer: Self-pay | Admitting: Genetic Counselor

## 2021-01-16 NOTE — Progress Notes (Signed)
UPDATE: POLE VUS has been reclassified to Likely Benign. The amended report date is January 15, 2021.

## 2021-10-23 ENCOUNTER — Ambulatory Visit: Payer: 59 | Admitting: Family Medicine

## 2021-10-23 ENCOUNTER — Encounter: Payer: Self-pay | Admitting: Family Medicine

## 2021-10-23 VITALS — BP 120/80 | HR 84 | Temp 98.8°F | Wt 212.3 lb

## 2021-10-23 DIAGNOSIS — J019 Acute sinusitis, unspecified: Secondary | ICD-10-CM

## 2021-10-23 MED ORDER — CEFDINIR 300 MG PO CAPS: 300.0000 mg | ORAL_CAPSULE | Freq: Two times a day (BID) | ORAL | 0 refills | Status: DC

## 2021-10-23 NOTE — Progress Notes (Signed)
? ?Established Patient Office Visit ? ?Subjective   ?Patient ID: Patty Bell, female    DOB: 1975-08-18  Age: 46 y.o. MRN: [id number] ? ?Chief Complaint  ?Patient presents with  ? Sinusitis  ? ? ?HPI ? ? ?Patient is seen with 1 month history of sinus pressure and nasal congestion.  She states around [place] time she developed what seemed like a typical cold with sore throat followed by nasal congestion and eventually some cough.  Sore throat symptoms have resolved but she has had significant nasal congestion and sinus pressure.  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  She has follow-up scan scheduled for the 23rd of this month.  Denies any severe headache.  She has had some chronic left ear symptoms ever since radiation from her tumor.  She does have reported allergy to penicillin but has tolerated cephalosporins in the past.  She is on Keppra.  No recent seizure. ? ?Past Medical History:  ?Diagnosis Date  ? Asthma   ? as a child  ? Family history of breast cancer   ? Family history of prostate cancer   ? Plantar fasciitis   ? Polyp of larynx   ? Pt states throat as a child  ? Scoliosis   ? bracing  ? ?Past Surgical History:  ?Procedure Laterality Date  ? CESAREAN SECTION N/A 08/06/2012  ? Procedure: CESAREAN SECTION;  Surgeon: Turner Daniels, MD;  Location: [facility];  Service: Obstetrics;  Laterality: N/A;  ? NO PAST SURGERIES    ? ? reports that she has never smoked. She has never used smokeless tobacco. She reports current alcohol use. She reports that she does not use drugs. ?family history includes Breast cancer (age of onset: 53) in her mother; Heart disease in an other family member; Hyperlipidemia in an other family member; Hypertension in an other family member; Liver cancer in her maternal uncle; Other in her maternal aunt and paternal grandmother; Prostate cancer in some other family members; Prostate cancer (age of onset: 55) in her paternal uncle; Skin cancer in her  paternal uncle; Stroke in an other family member. ?Allergies  ?Allergen Reactions  ? Amoxicillin   ?  REACTION: rash  ? Penicillins Other (See Comments)  ?  Happened in childhood - reaction unknown  ? ? ?Review of Systems  ?Constitutional:  Positive for malaise/fatigue. Negative for chills and fever.  ?HENT:  Positive for congestion and sinus pain.   ?Neurological:  Negative for dizziness.  ? ?  ?Objective:  ?  ? ?BP 120/80 (BP Location: Left Arm, Patient Position: Sitting, Cuff Size: Normal)   Pulse 84   Temp 98.8 ?F (37.1 ?C) (Oral)   Wt 212 lb 4.8 oz (96.3 kg)   SpO2 99%   BMI 31.35 kg/m?  ? ? ?Physical Exam ?Vitals reviewed.  ?Constitutional:   ?   Appearance: Normal appearance.  ?HENT:  ?   Right Ear: Tympanic membrane normal.  ?   Left Ear: Tympanic membrane normal.  ?   Mouth/Throat:  ?   Pharynx: Oropharynx is clear. No oropharyngeal exudate or posterior oropharyngeal erythema.  ?Cardiovascular:  ?   Rate and Rhythm: Normal rate and regular rhythm.  ?Pulmonary:  ?   Effort: Pulmonary effort is normal.  ?   Breath sounds: Normal breath sounds.  ?Musculoskeletal:  ?   Cervical back: Neck supple.  ?Lymphadenopathy:  ?   Cervical: No cervical adenopathy.  ?Neurological:  ?   Mental Status: She is  alert.  ? ? ? ?No results found for any visits on 10/23/21. ? ? ? ?The ASCVD Risk score (Arnett DK, et al., 2019) failed to calculate for the following reasons: ?  Cannot find a previous HDL lab ?  Cannot find a previous total cholesterol lab ? ?  ?Assessment & Plan:  ? ?1 month history of sinus congestive symptoms.  Concern for acute sinusitis given duration of symptoms. ? ?Start Omnicef 300 mg twice daily for 10 days.  Follow-up for any persistent or worsening symptoms. ? ?No follow-ups on file.  ? ? ?Evelena Peat, MD ? ?

## 2022-01-18 ENCOUNTER — Encounter: Payer: Self-pay | Admitting: Internal Medicine

## 2022-01-18 LAB — HM MAMMOGRAPHY

## 2022-04-02 ENCOUNTER — Ambulatory Visit: Payer: 59 | Admitting: Family Medicine

## 2022-04-02 ENCOUNTER — Encounter: Payer: Self-pay | Admitting: Family Medicine

## 2022-04-02 VITALS — BP 132/82 | HR 90 | Temp 98.0°F | Ht 69.0 in | Wt 219.1 lb

## 2022-04-02 DIAGNOSIS — L299 Pruritus, unspecified: Secondary | ICD-10-CM | POA: Diagnosis not present

## 2022-04-02 NOTE — Progress Notes (Signed)
Established Patient Office Visit  Subjective   Patient ID: Patty Bell, female    DOB: [birth year]  Age: 46 y.o. MRN: [id number]  Chief Complaint  Patient presents with   ear itchiness    Paitient complains of left ear itchiness,    Ear Pain    Patient complains of left ear pain,     HPI   Patty Bell is seen with pruritus involving the left ear canal past couple weeks.  No drainage.  Occasional fleeting sharp pain but mostly itching.  She does have history of anaplastic astrocytoma with surgery back in February 2021.  Has done well since then.  Denies any recent headaches.  No hearing changes.  No vertigo.  No visual changes.  Denies any history of recurrent otitis.  No recent swimming.  Occasional Q-tip use.  Past Medical History:  Diagnosis Date   Asthma    as a child   Family history of breast cancer    Family history of prostate cancer    Plantar fasciitis    Polyp of larynx    Pt states throat as a child   Scoliosis    bracing   Past Surgical History:  Procedure Laterality Date   CESAREAN SECTION N/A 08/06/2012   Procedure: CESAREAN SECTION;  Surgeon: Patty Lex, MD;  Location: [facility];  Service: Obstetrics;  Laterality: N/A;   NO PAST SURGERIES      reports that she has never smoked. She has never used smokeless tobacco. She reports current alcohol use. She reports that she does not use drugs. family history includes Breast cancer (age of onset: 80) in her mother; Heart disease in an other family member; Hyperlipidemia in an other family member; Hypertension in an other family member; Liver cancer in her maternal uncle; Other in her maternal aunt and paternal grandmother; Prostate cancer in some other family members; Prostate cancer (age of onset: 53) in her paternal uncle; Skin cancer in her paternal uncle; Stroke in an other family member. Allergies  Allergen Reactions   Amoxicillin     REACTION: rash   Penicillins Other (See Comments)    Happened in childhood -  reaction unknown    Review of Systems  Constitutional:  Negative for chills and fever.  HENT:  Negative for congestion, ear discharge, hearing loss and tinnitus.   Neurological:  Negative for headaches.      Objective:     BP 132/82 (BP Location: Left Arm, Patient Position: Sitting, Cuff Size: Normal)   Pulse 90   Temp 98 F (36.7 C) (Oral)   Ht 5\' 9"  (1.753 m)   Wt 219 lb 1.6 oz (99.4 kg)   SpO2 98%   BMI 32.36 kg/m    Physical Exam Vitals reviewed.  Constitutional:      Appearance: Normal appearance.  HENT:     Head:     Comments: Right eardrum and ear canal are normal.  Left canal is normal.  Minimal cerumen near the superior portion.  Eardrum appears normal with gray color and normal landmarks.  No eczema changes in the ear canal.  No erythema.  No drainage. Cardiovascular:     Rate and Rhythm: Normal rate and regular rhythm.  Neurological:     Mental Status: She is alert.      No results found for any visits on 04/02/22.    The ASCVD Risk score (Arnett DK, et al., 2019) failed to calculate for the following reasons:   Cannot find a previous HDL  lab   Cannot find a previous total cholesterol lab    Assessment & Plan:   Localized pruritus left ear canal.  Normal exam.  No eczema changes.  No evidence for otitis externa.  Eardrum appears normal.  -Reassurance and observation for now. -Follow-up for any persistent symptoms. -Consider trial of mometasone lotion once daily if symptoms persist  No follow-ups on file.    Patty Littler, MD

## 2022-04-02 NOTE — Patient Instructions (Signed)
Ear drums look normal as do your ear canals  Follow up for any persistent itching or other concerns.

## 2022-04-15 ENCOUNTER — Encounter: Payer: Self-pay | Admitting: Family Medicine

## 2022-04-15 ENCOUNTER — Ambulatory Visit: Payer: 59 | Admitting: Family Medicine

## 2022-04-15 VITALS — BP 110/78 | HR 78 | Temp 98.7°F | Wt 219.0 lb

## 2022-04-15 DIAGNOSIS — H6992 Unspecified Eustachian tube disorder, left ear: Secondary | ICD-10-CM

## 2022-04-15 NOTE — Progress Notes (Signed)
   Subjective:    Patient ID: Patty Bell, female    DOB: 1975/10/18, 46 y.o.   MRN: [id number]  HPI Here for 3 months of intermittent pain in the left ear. She has allergy symptoms every year from late summer to winter, and these cause typical stuffiness in the sinuses and nose. The ear pain usually flares up when the allergies flare up. She denies any headache or fever or ST or cough. No dizziness or hearing loss.    Review of Systems  Constitutional: Negative.   HENT:  Positive for congestion and ear pain. Negative for hearing loss, postnasal drip, sinus pain, sneezing, sore throat and tinnitus.   Eyes: Negative.   Respiratory: Negative.         Objective:   Physical Exam Constitutional:      Appearance: Normal appearance.  HENT:     Right Ear: Tympanic membrane, ear canal and external ear normal.     Left Ear: Tympanic membrane, ear canal and external ear normal.     Nose: Nose normal.     Mouth/Throat:     Pharynx: Oropharynx is clear.  Eyes:     Conjunctiva/sclera: Conjunctivae normal.  Pulmonary:     Effort: Pulmonary effort is normal.     Breath sounds: Normal breath sounds.  Lymphadenopathy:     Cervical: No cervical adenopathy.  Neurological:     Mental Status: She is alert.           Assessment & Plan:  Left ear pain, likely due to eustachian tube dysfunction. She will use Flonase spray and take Claritin D daily, at least during this time of year. Recheck as needed. Alysia Penna, MD

## 2022-05-02 ENCOUNTER — Ambulatory Visit: Payer: 59 | Admitting: Family Medicine

## 2024-03-16 LAB — HM MAMMOGRAPHY
